# Patient Record
Sex: Female | Born: 1935 | Race: White | Hispanic: No | Smoking: Former smoker
Health system: Southern US, Community
[De-identification: ages and names within clinical notes are randomized; demographics above are authoritative.]

---

## 1999-04-15 ENCOUNTER — Inpatient Hospital Stay (HOSPITAL_COMMUNITY): Admission: EM | Admit: 1999-04-15 | Discharge: 1999-04-25 | Payer: Self-pay | Admitting: Emergency Medicine

## 1999-04-15 ENCOUNTER — Encounter: Payer: Self-pay | Admitting: Emergency Medicine

## 1999-04-17 ENCOUNTER — Encounter: Payer: Self-pay | Admitting: Cardiology

## 1999-04-18 ENCOUNTER — Encounter: Payer: Self-pay | Admitting: Neurology

## 1999-04-19 ENCOUNTER — Encounter: Payer: Self-pay | Admitting: Cardiology

## 1999-04-21 ENCOUNTER — Encounter: Payer: Self-pay | Admitting: Cardiology

## 1999-06-24 ENCOUNTER — Inpatient Hospital Stay (HOSPITAL_COMMUNITY): Admission: EM | Admit: 1999-06-24 | Discharge: 1999-07-04 | Payer: Self-pay | Admitting: Internal Medicine

## 1999-06-24 ENCOUNTER — Encounter: Payer: Self-pay | Admitting: Internal Medicine

## 1999-06-25 ENCOUNTER — Encounter (INDEPENDENT_AMBULATORY_CARE_PROVIDER_SITE_OTHER): Payer: Self-pay | Admitting: *Deleted

## 1999-06-26 ENCOUNTER — Encounter: Payer: Self-pay | Admitting: Thoracic Surgery (Cardiothoracic Vascular Surgery)

## 1999-06-27 ENCOUNTER — Encounter: Payer: Self-pay | Admitting: Thoracic Surgery (Cardiothoracic Vascular Surgery)

## 1999-06-28 ENCOUNTER — Encounter: Payer: Self-pay | Admitting: Thoracic Surgery (Cardiothoracic Vascular Surgery)

## 1999-06-29 ENCOUNTER — Encounter: Payer: Self-pay | Admitting: Thoracic Surgery (Cardiothoracic Vascular Surgery)

## 1999-09-02 ENCOUNTER — Encounter (HOSPITAL_COMMUNITY): Admission: RE | Admit: 1999-09-02 | Discharge: 1999-12-01 | Payer: Self-pay | Admitting: Cardiology

## 1999-11-11 ENCOUNTER — Encounter: Payer: Self-pay | Admitting: Cardiology

## 1999-11-11 ENCOUNTER — Ambulatory Visit (HOSPITAL_COMMUNITY): Admission: RE | Admit: 1999-11-11 | Discharge: 1999-11-11 | Payer: Self-pay | Admitting: Cardiology

## 1999-12-02 ENCOUNTER — Encounter (HOSPITAL_COMMUNITY): Admission: RE | Admit: 1999-12-02 | Discharge: 2000-03-01 | Payer: Self-pay | Admitting: Cardiology

## 2000-05-17 ENCOUNTER — Encounter (HOSPITAL_COMMUNITY): Admission: RE | Admit: 2000-05-17 | Discharge: 2000-08-15 | Payer: Self-pay | Admitting: Cardiology

## 2000-08-16 ENCOUNTER — Encounter (HOSPITAL_COMMUNITY): Admission: RE | Admit: 2000-08-16 | Discharge: 2000-11-14 | Payer: Self-pay | Admitting: Cardiology

## 2001-11-04 ENCOUNTER — Ambulatory Visit (HOSPITAL_COMMUNITY): Admission: RE | Admit: 2001-11-04 | Discharge: 2001-11-04 | Payer: Self-pay | Admitting: *Deleted

## 2004-10-16 ENCOUNTER — Encounter: Admission: RE | Admit: 2004-10-16 | Discharge: 2004-10-16 | Payer: Self-pay | Admitting: Internal Medicine

## 2004-10-29 ENCOUNTER — Encounter: Admission: RE | Admit: 2004-10-29 | Discharge: 2004-10-29 | Payer: Self-pay | Admitting: Internal Medicine

## 2004-11-18 ENCOUNTER — Inpatient Hospital Stay (HOSPITAL_COMMUNITY): Admission: EM | Admit: 2004-11-18 | Discharge: 2004-12-02 | Payer: Self-pay | Admitting: Emergency Medicine

## 2004-11-19 ENCOUNTER — Encounter (INDEPENDENT_AMBULATORY_CARE_PROVIDER_SITE_OTHER): Payer: Self-pay | Admitting: *Deleted

## 2005-03-09 ENCOUNTER — Encounter: Admission: RE | Admit: 2005-03-09 | Discharge: 2005-03-09 | Payer: Self-pay | Admitting: Internal Medicine

## 2005-04-01 ENCOUNTER — Encounter: Admission: RE | Admit: 2005-04-01 | Discharge: 2005-04-01 | Payer: Self-pay | Admitting: Geriatric Medicine

## 2006-05-25 ENCOUNTER — Encounter: Admission: RE | Admit: 2006-05-25 | Discharge: 2006-05-25 | Payer: Self-pay | Admitting: Geriatric Medicine

## 2007-05-22 ENCOUNTER — Inpatient Hospital Stay (HOSPITAL_COMMUNITY): Admission: EM | Admit: 2007-05-22 | Discharge: 2007-05-28 | Payer: Self-pay | Admitting: Emergency Medicine

## 2007-05-22 ENCOUNTER — Ambulatory Visit: Payer: Self-pay | Admitting: Internal Medicine

## 2007-05-23 ENCOUNTER — Ambulatory Visit: Payer: Self-pay | Admitting: Vascular Surgery

## 2007-05-23 ENCOUNTER — Encounter (INDEPENDENT_AMBULATORY_CARE_PROVIDER_SITE_OTHER): Payer: Self-pay | Admitting: Internal Medicine

## 2007-06-18 IMAGING — US US PELVIS COMPLETE MODIFY
1 series · 14 of 21 positions shown · non-contrast
Comparison: none

CLINICAL DATA: Follow up cyst noted in pelvis on MR.
 ULTRASOUND OF THE PELVIS:
 The MR of the lumbar spine of 10/16/04 describes a cystic structure in the left adnexa of 3.3 cm.  This patient could not tolerate transvaginal ultrasound and therefore, only transabdominal pelvic ultrasound was performed.  The uterus is normal in size for age measuring 6.0 cm sagittally with a depth of 3.0 cm and width of 2.6 cm.  Endometrium could not be visualized but did not appear to be abnormally thickened.  Right ovary was not visible, and no left ovarian tissue was seen.  Within the left adnexa, there is a cystic structure of 4.9 x 2.5 x 3.4 cm.  No internal septations or soft tissue is evident.  Follow up ultrasound may be helpful to assess stability over time.  No free fluid is seen.

[Series 1: unknown · 0.26mm/px · 14 of 21 slices shown]
[im 1/21]
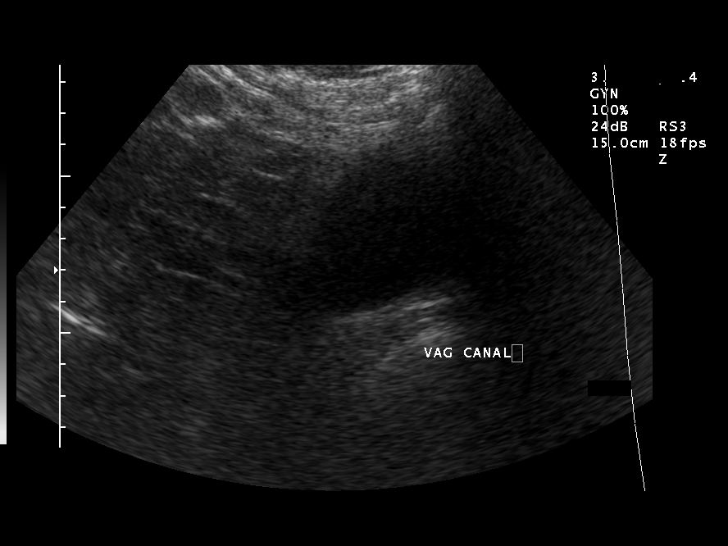
[im 3/21]
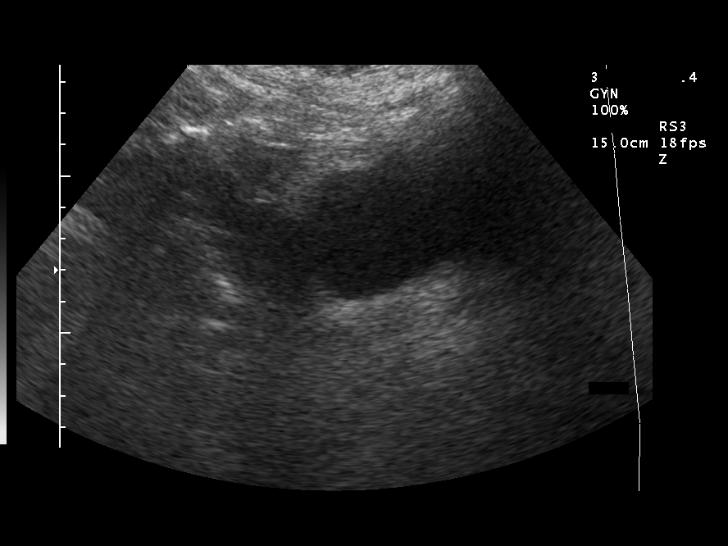
[im 4/21]
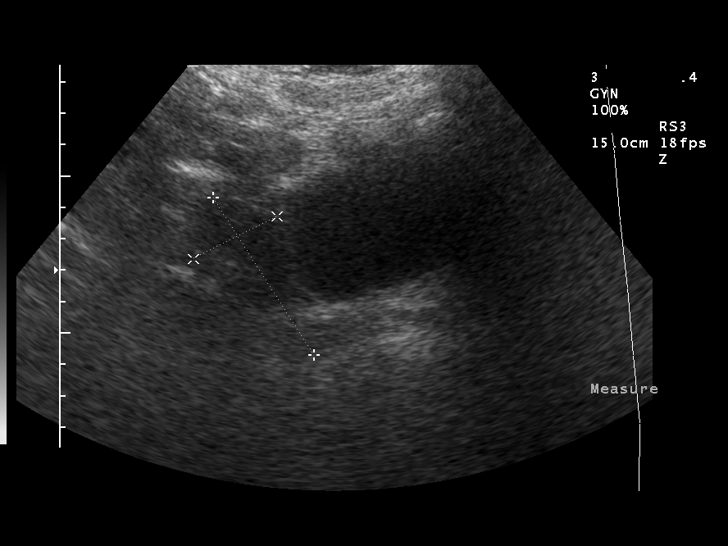
[im 6/21]
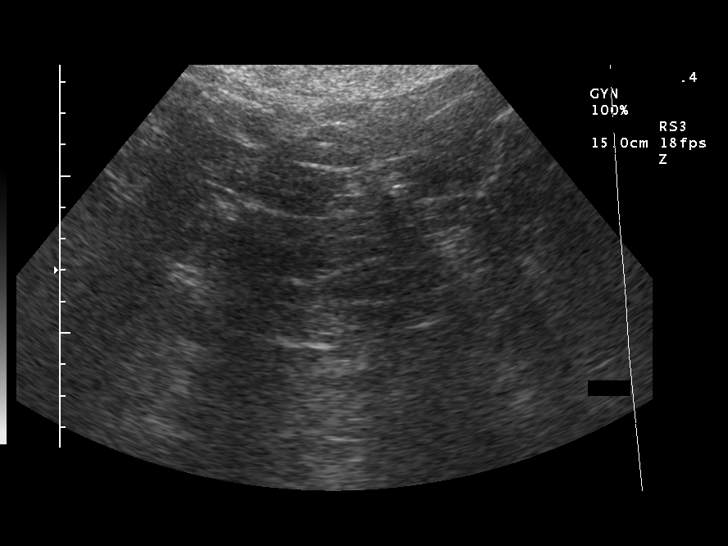
[im 7/21]
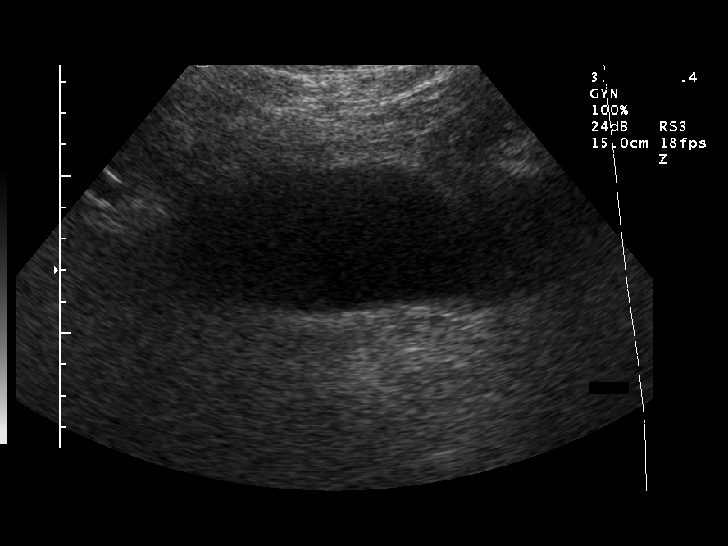
[im 9/21]
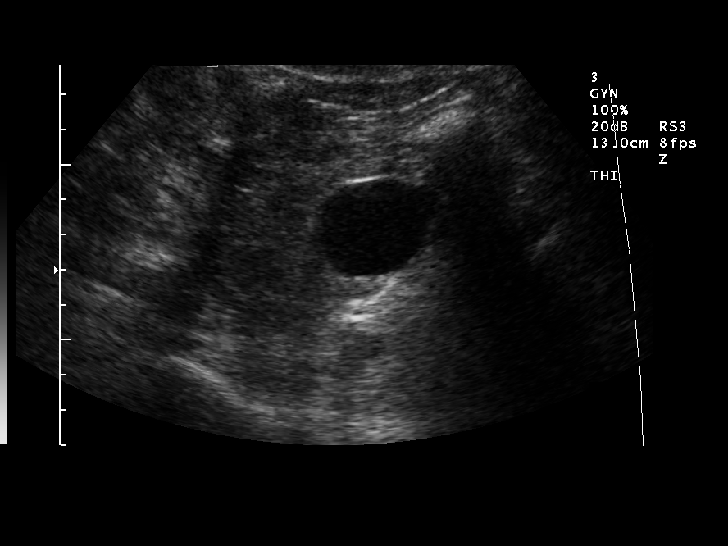
[im 10/21]
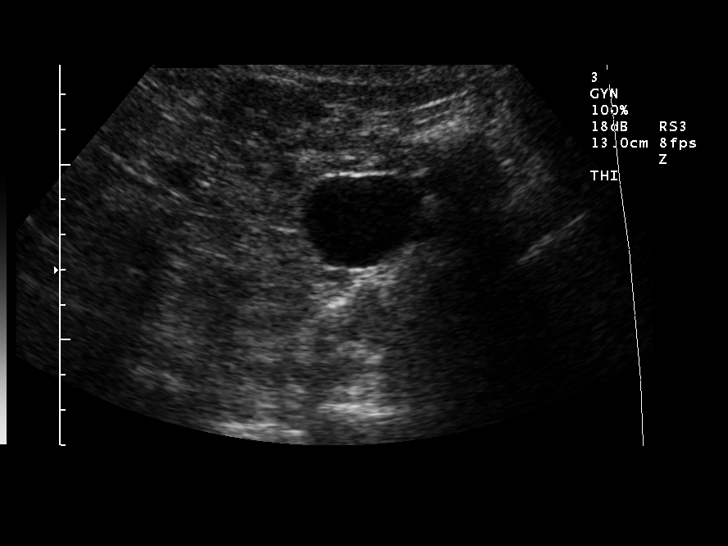
[im 12/21]
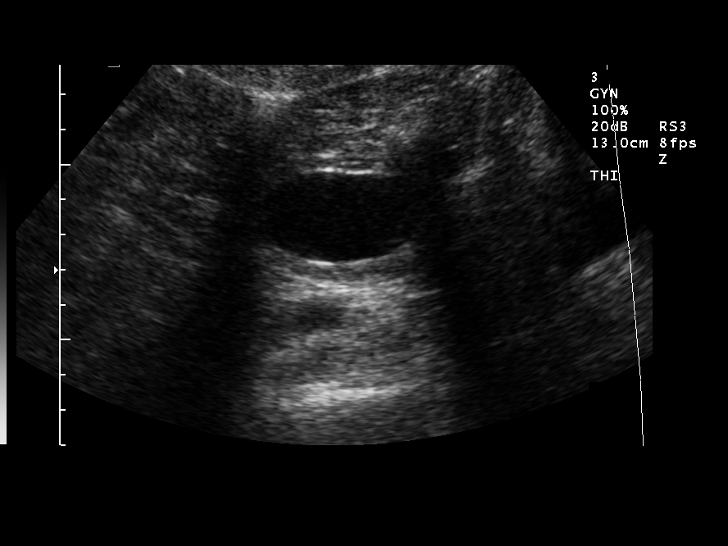
[im 13/21]
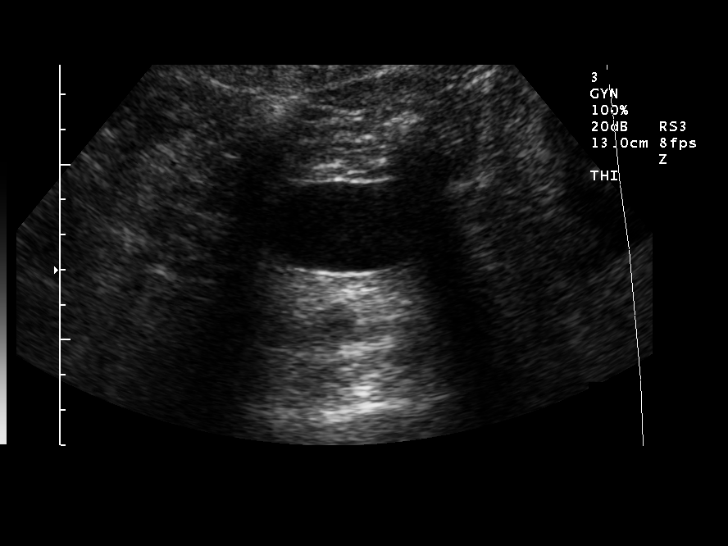
[im 15/21]
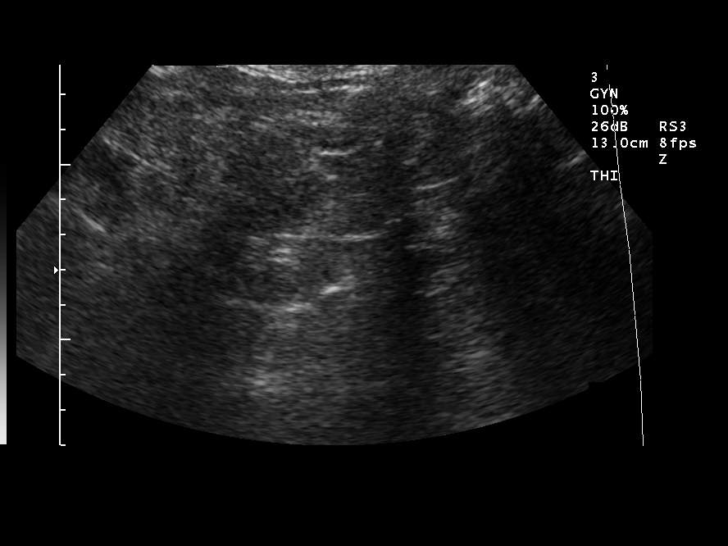
[im 16/21]
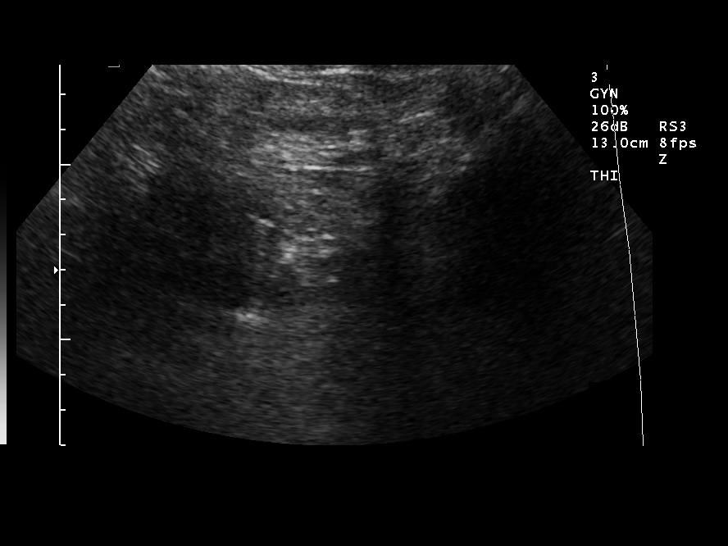
[im 18/21]
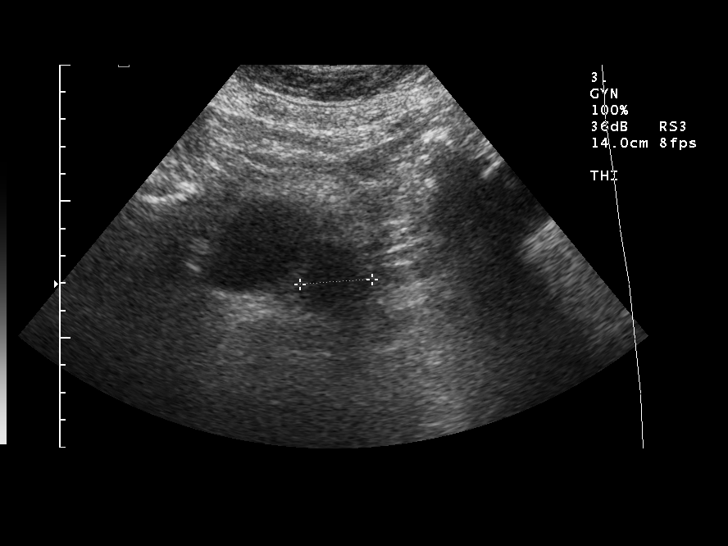
[im 19/21]
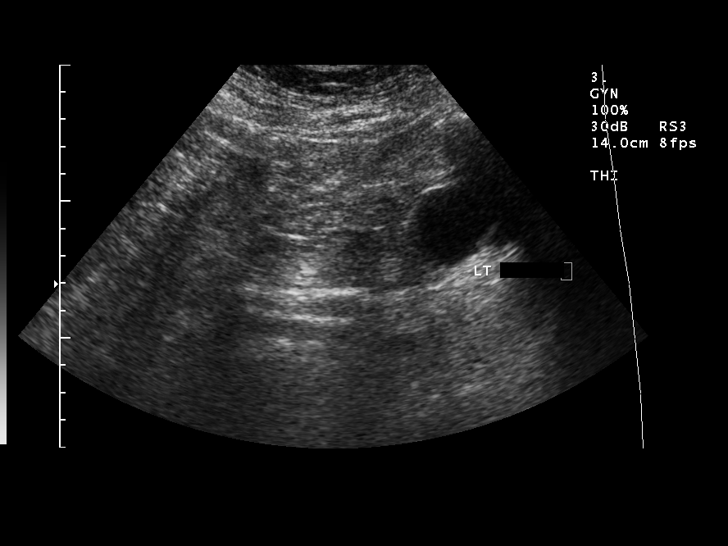
[im 21/21]
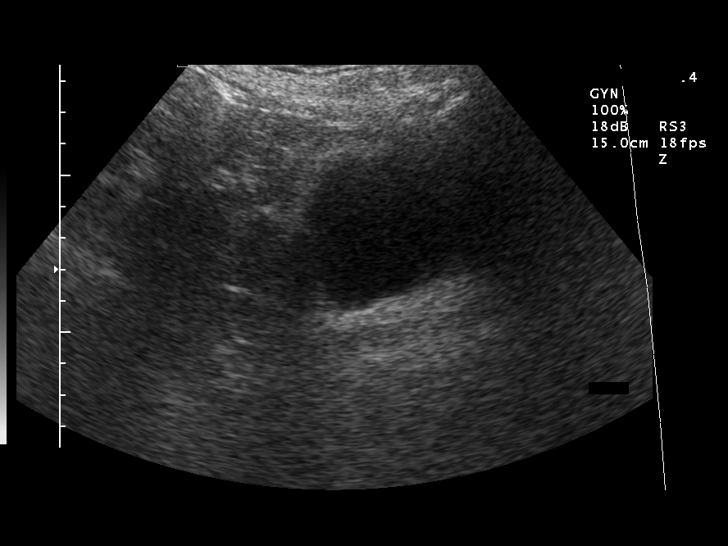

[14 of 21 positions shown; findings below may reference images not displayed]

IMPRESSION: Simple left adnexal cyst is noted of 4.9 x 2.5 x 3.4 cm.  No definite ovarian tissue is seen on either the left or the right.  Consider follow up ultrasound in four months.

## 2008-05-11 ENCOUNTER — Encounter: Admission: RE | Admit: 2008-05-11 | Discharge: 2008-05-11 | Payer: Self-pay | Admitting: Geriatric Medicine

## 2008-07-17 ENCOUNTER — Encounter: Admission: RE | Admit: 2008-07-17 | Discharge: 2008-07-17 | Payer: Self-pay | Admitting: Geriatric Medicine

## 2010-02-16 ENCOUNTER — Encounter: Payer: Self-pay | Admitting: Geriatric Medicine

## 2010-06-10 NOTE — Cardiovascular Report (Signed)
Virginia Herrera, AHMAD            ACCOUNT NO.:  [id number]   MEDICAL RECORD NO.:  [id number]          PATIENT TYPE:  INP   LOCATION:  3701                         FACILITY:  [facility]   PHYSICIAN:  Antionette Char, MD    DATE OF BIRTH:  Jun 13, 1911   DATE OF PROCEDURE:  05/24/2007  DATE OF DISCHARGE:                            CARDIAC CATHETERIZATION   PROCEDURES:  1. Left heart catheterization.  2. Coronary cineangiography.  3. Vein graft cines.  4. Left internal mammary arterial graft cines.  5. Left ventricular angiography.   INDICATION FOR PROCEDURES:  This 77 year old female was admitted with a  syncopal episode with collapse.  She has a history of coronary artery  disease with a prior syncopal episode requiring resuscitation in 2001.  This was followed by a cardiac cath and subsequent coronary artery  bypass graft surgery.  She now returns with a syncopal episode with  collapse and her cardiac enzymes were abnormal for myocardial ischemia.  She was scheduled for cardiac cath today to assess her coronary and  graft status.   PROCEDURE:  After signing an informed consent, the patient was  premedicated with 5 mg of Valium by mouth and brought to the cardiac  catheterization lab at [facility].  Her right groin was prepped  and draped in sterile fashion and anesthetized locally with 1%  lidocaine.  A 6-French introducer sheath was inserted percutaneously  into the right femoral artery.  6-French #4 Judkins coronary catheters  were used to make injections into the native coronary arteries.  The  right coronary catheter was used to make injections into the vein grafts  and also into the left internal mammary arterial graft to the LAD.  A 6-  French pigtail catheter was used to measure pressures in the left  ventricle and aorta and to make a midstream injection into the left  ventricle.  A pullback across the aortic valve was recorded.  The  patient tolerated the procedure  well and no complications were noted.  At the end of the procedure, the catheter and sheath removed from the  right femoral artery and hemostasis was easily obtained.  She was then  monitored in the holding area until transferred back to her telemetry  bed.  Medications given, none.   HEMODYNAMIC DATA:  There was no gradient across the aortic valve.   CINE FINDINGS:  Coronary cineangiography, left coronary artery, the  ostium and left main are severely diseased with total occlusion of the  left main in its proximal segment.  There is no antegrade flow into the  left coronary artery.  The LAD and circumflex filled by way of grafts.  Right coronary artery, the right coronary artery is severely diseased  throughout.  The ostium has a stenotic plaque and this plaque extends  into the middle segment.  There are irregularities throughout the  proximal and middle segment and also extending into the crux.  The  posterior descending is totally occluded at its origin.  The first large  anterolateral branch to the left ventricle has a critical stenosis in  its proximal segment of approximately  95%.  The distal portion of this  branch fills by way of vein graft.  The posterior descending also fills  by way of vein graft.  The vein grafts cines, right coronary artery vein  graft; this is a sequential vein graft to the distal right coronary  system with a side-to-side anastomosis within the posterior descending  and good flow seen into the posterior descending.  The posterior  descending has a plaque in its middle segment causing a 40-50%  narrowing.  The inside anastomosis in the posterolateral branch appears  normal and there is very good flow into the large posterolateral branch.  No further plaque is seen in the middle or distal segment.  Circumflex  vein graft, this vein graft also is normal in appearance with a distal  anastomosis into a large obtuse marginal branch.  There is very good  flow  into this obtuse marginal branch with retrograde flow showing a  severe stenosis in its proximal segment.  The circumflex and AV groove  is also visualized and appears to be fairly normal.  The lesion in the  proximal large obtuse marginal branch is approximately 90% stenotic, but  is essentially unapproachable for an intravascular intervention.  Left  internal mammary arterial graft to the LAD, the LIMA graft is normal in  appearance and has a normal anastomosis in the mid LAD.  The entire LAD  is visualized and there is severe disease proximally with several 90%  focal lesions in the proximal segment and a focal 90% stenosis in the  distal segment.  Left ventricular cineangiogram, the left ventricular  chamber size and contractility appear normal.  The overall left  ventricular contractility appears normal with an ejection fraction of  approximately 60%.  There is mild mitral insufficiency seen.  However,  this is difficult to determine because of the frequency of ectopic beats  when the pigtail catheter was within the left ventricular cavity.   FINAL DIAGNOSES:  1. Severe three-vessel coronary artery disease with totally occluded      left main coronary artery and diffusely diseased right coronary      artery with occluded right posterior descending artery.  2. Very good appearance of her grafts with a sequential vein graft to      her right posterior descending and right posterolateral branch to      the left ventricle, normal-appearing vein graft to the circumflex,      and normal-appearing LIMA graft to the LAD.  3. Good left ventricular function and ejection fraction of      approximately 60%.  4. Mild mitral insufficiency.   DISPOSITION:  After reviewing her cines with Dr. Clarene Duke, we found that  there are several areas of potential ischemia, but these areas are not  amenable to an intravascular intervention procedure.  Therefore, we  should continue to treat her medically.   With the 2 episodes of syncope  associated with collapse and myocardial ischemia, we should consider  ventricular tachycardia as the etiology of her syncope.  We will ask the  electrophysiology service to see her in consultation.      Antionette Char, MD  Electronically Signed     JRT/MEDQ  D:  05/24/2007  T:  05/25/2007  Job:  [id number]

## 2010-06-10 NOTE — Op Note (Signed)
NAME:  Virginia Herrera, Virginia Herrera            ACCOUNT NO.:  [id number]   MEDICAL RECORD NO.:  [id number]          PATIENT TYPE:  INP   LOCATION:  3701                         FACILITY:  [facility]   PHYSICIAN:  Rollene Rotunda, MD, FACCDATE OF BIRTH:  [birth year]   DATE OF PROCEDURE:  05/27/2007  DATE OF DISCHARGE:                               OPERATIVE REPORT   PREOPERATIVE DIAGNOSIS:  Syncope.   POSTOPERATIVE DIAGNOSIS:  Syncope.   PROCEDURE:  Implantable loop recorder.   PROCEDURE NOTE:  The patient signed informed consent.  This procedure  was suggested after EP consult with Dr. Lewayne Bunting.  The patient was  brought to the catheterization lab.  [employer] mapped an area for optimal  waveform capture over her left chest.  The patient's left chest was then  prepped and draped in a sterile fashion.  Her subcutaneous tissue was  anesthetized with lidocaine infusion.  A 3-cm incision was carried down  through the subcutaneous tissue.  Then, this was carried down further to  the prepectoral fascia with blunt dissection.  A pocket for the device  was formed, primarily with blunt dissection.  The pocket was copiously  irrigated with kanamycin solution.  The device which was a Transport planner, model number [id number], serial number [id number] was then implanted  and sutured to the prepectoral fascia using 2 silk pop-off sutures.  The  device was placed in the pocket and copiously irrigated again.  The  wound was then closed with 2-0 Vicryl in running vertical mattress  followed by the same 2-0 Vicryl in a running horizontal mattress.  The  subcutaneous layer was 4-0 Vicryl in a running horizontal mattress.  The  wound was prepped with benzoin and closed with Steri-Strips.  The  patient left the lab in stable condition.  There were no complications.      Rollene Rotunda, MD, [facility]  Electronically Signed     JH/MEDQ  D:  05/27/2007  T:  05/28/2007  Job:  [id number]   cc:   Antionette Char, MD

## 2010-06-10 NOTE — H&P (Signed)
NAME:  Virginia Herrera, ROGEL            ACCOUNT NO.:  [id number]   MEDICAL RECORD NO.:  [id number]          PATIENT TYPE:  INP   LOCATION:  3701                         FACILITY:  [facility]   PHYSICIAN:  Kela Millin, M.D.DATE OF BIRTH:  1911-06-16   DATE OF ADMISSION:  05/22/2007  DATE OF DISCHARGE:                              HISTORY & PHYSICAL   PRIMARY CARE PHYSICIAN:  Hal T. Stoneking, M.D.   CHIEF COMPLAINT:  Status post fall and unable to get up for 1-2 days.   HISTORY OF PRESENT ILLNESS:  The patient is a 77 year old white female  resident of [facility] with past medical  history significant for MI/coronary artery disease, congestive heart  failure with last ejection fraction 30-40% in 2006, hypertension,  history of pneumonia with respiratory failure, anxiety, and anemia, who  presents with above complaints.  She states that she does not remember  how she even got to the floor because she was not aware that she had  fallen and was on the floor until EMS came into her apartment and found  her there.  It is noted in the ER records that the patient initially  reported that she last remembered getting up to go to the bathroom, but  at the time of my interview, she did not even know that she was on the  floor.  All she was aware of is that she was in bed and trying to get  someone to help her because she was unable to get up.  She states that  she called out for some time, but nobody ever came to her aid.  She  remembers that earlier in the week, she had gone out and visited some  family, and they brought her back to her apartment.  She thinks she had  been lying on the floor for at least one whole day and one whole  tonight, until today, Sunday, when her friends came by to pick her up to  go to church, and when they were unable to get into the apartment, they  called someone with [facility], who was able to come and open the doors,  then they found the  patient on the floor, and EMS was called.  She  remembers that because she was unable to get up, she urinated on herself  and was lying on her left side all the while and developed blisters on  her left arm and shoulder area.  This had burst open at the time she was  seen in the ER.  It is unclear whether the patient had a syncopal  episode, as she does not recall the events that led up to her being on  the floor.  She denies chest pain, cough, fevers, shortness of breath,  diarrhea, melena, hematochezia, dysuria, and no nausea or vomiting.   The patient was seen in the ER, and a CT scan of her brain showed no  evidence for acute hemorrhage.  Cerebral atrophy and evidence for  chronic small vessel ischemic changes noted.   An x-ray of her left shoulder revealed diffuse osteopenia with no  visible fracture or dislocation.  Also, a left humerus x-ray showed no  fracture or dislocation.   X-rays of her left forearm and left elbow were negative for fractures as  well.   Other lab work included a urinalysis, which showed a large hemoglobin  but only with 7-10 WBCs and otherwise negative for infection.  Her white  cell count was 15.4 with a hemoglobin of 15, hematocrit 44.7, and her  point-of-care markers were negative.  Her BUN 37 with a creatinine of  1.6.  It is noted that her last creatinine in 2006 per E-chart was 1.3.  She is admitted for further evaluation and management.   PAST MEDICAL HISTORY:  1. As above.  2. History of abnormal thyroid function tests with question of      hypothyroidism but on no medications.  3. Question history of diabetes.  On no medications.   MEDICATIONS:  1. Aspirin 81 mg daily.  2. Colace.  3. Cozaar.  4. Lasix 160 mg daily.  5. Isosorbide mononitrate 30 mg daily.  6. KCL 20 mEq 2 tablets daily.  7. Metoprolol 25 mg b.i.d.  8. Zocor 20 mg nightly.   ALLERGIES/INTOLERANCES:  She is intolerant of MORPHINE and VIOXX per old  records.   SOCIAL  HISTORY:  Remote history of tobacco abuse.  She denies alcohol.   FAMILY HISTORY:  Her mother had a stroke, and one of her siblings had an  MI in his 35s.   REVIEW OF SYSTEMS:  As per HPI.  Other review of systems negative.   PHYSICAL EXAMINATION:  GENERAL:  The patient is a pleasant elderly white  female.  She is alert, oriented, and appropriate in no respiratory  distress.  VITAL SIGNS:  Her temperature is 96.9.  Blood pressure is 146/58,  initially 189/85.  Pulse 95.  Respiratory rate 18.  O2 sat was 96%.  HEENT:  PERRL.  EOMI.  Sclerae are anicteric.  Moist mucous membranes.  Normocephalic and atraumatic.  NECK:  Supple.  No adenopathy.  No JVD.  No thyromegaly.  LUNGS:  Decreased breath sounds at the bases.  No crackles.  No wheezes.  CARDIOVASCULAR:  Regular rate and rhythm.  Normal S1 and S2.  No S3  appreciated.  ABDOMEN:  Soft, obese.  Bowel sounds present.  Nontender, nondistended.  No organomegaly.  No masses palpable.  EXTREMITIES:  On the flexor surface of her left upper extremity, she has  mild erythema and moderate-sized areas of skin that is de-  epithelialized, desquamated.  Also a similar area on her left knee area.  No edema.  No cyanosis.  NEURO:  Alert and oriented x3.  Cranial nerves II-XII are grossly  intact.  No facial asymmetry.  Her strength is about 4/5 and symmetric.  Nonfocal exam.   LABORATORY DATA:  As per HPI.  Also, her platelet count is 166.  Her  sodium is 143 with a potassium of 3.7, chloride 107, BUN 37, creatinine  1.6, glucose 110.  Her total CK is 5175.  MB of 35.3.  Relative index  within normal limits at 0.7.   ASSESSMENT/PLAN:  1. Fall/?syncope:  CT scan of the head is negative.  Will monitor on      telemetry.  Obtain cardiac enzymes.  A corrected Doppler ultrasound      as well as a 2D echo.  2. Rhabdomyolysis:  Cautiously hydrate with normal saline for saline      diuresis.  Monitoring fluid status, given history  of congestive       heart failure, follow and recheck.  3. Acute-on-chronic renal insufficiency:  As above.  Hydrate.      Recheck.  Hold ARB for now and follow.  4. Leukocytosis:  Urinalysis negative for infection.  Will obtain      chest x-ray.  Follow and treat accordingly.  5. Question of hypothyroidism:  Will check a TSH level.  6. Question history of diabetes:  On no medications.  Monitor Accu-      Cheks, sliding scale, and obtain a hemoglobin A1C level.  7. Hypertension:  Continue beta blockers.  8. History of congestive heart failure:  Compensated.  Continue beta      blockers and monitor fluid status, as      discussed above.  9. History of anemia:  H&H stable.  Follow.  10.Left upper extremity wounds:  Local wound care/wound care consult.      Follow.      Kela Millin, M.D.  Electronically Signed     ACV/MEDQ  D:  05/23/2007  T:  05/23/2007  Job:  [id number]   cc:   Hal T. Stoneking, M.D.

## 2010-06-10 NOTE — Consult Note (Signed)
NAME:  Virginia Herrera, Virginia Herrera            ACCOUNT NO.:  [id number]   MEDICAL RECORD NO.:  [id number]          PATIENT TYPE:  INP   LOCATION:  3701                         FACILITY:  [facility]   PHYSICIAN:  Doylene Canning. Ladona Ridgel, MD    DATE OF BIRTH:  [birth year]   DATE OF CONSULTATION:  05/26/2007  DATE OF DISCHARGE:                                 CONSULTATION   REQUESTING PHYSICIAN:  Dr. Charolette Child.   INDICATION FOR CONSULTATION:  Evaluation of the patient with syncope.   HISTORY OF PRESENT ILLNESS:  The patient is 77 years old.  She has done  amazingly well through the years, though she does have known coronary  disease and is status post bypass surgery in the past.  This is in 2001  at age 12.  Despite this, she has been quite active until she presented  to the hospital.  The patient states that she is not quite sure what  happened to her, she was sleeping and found herself on the floor and  apparently had been down for at least 12 hours if not 24 hours.  She had  mild-to-moderate rhabdomyolysis.  She has recovered from this.  Her  cardiac enzymes however despite the elevation of the MB and the troponin  and for this she underwent catheterization by Dr. Aleen Campi.  This  demonstrated severe three-vessel coronary disease, but there was no  stenoses of her grafts.  She was noted to have preserved LV function by  LV gram.  Review of her cath films however did demonstrate several  potential areas of ischemia, which were not thought to be minimal to  intravascular intervention.  The patient is now referred for additional  evaluation.  She denies chest pain.  She denies nausea and vomiting.   PAST MEDICAL HISTORY:  Additional past medical history is notable for  chronic renal insufficiency.  There is a questionable history of  borderline diabetes.  She has hypertension.   FAMILY HISTORY:  Noncontributory at her advanced age.   SOCIAL HISTORY:  The patient lives independently.  She has a remote  history of tobacco use, but none now.  She denies alcohol use.   REVIEW OF SYSTEMS:  Otherwise, her review of systems is negative except  as noted in the HPI.   PHYSICAL EXAMINATION:  GENERAL:  She is a pleasant elderly-appearing  woman who looks younger than her stated age.  VITAL SIGNS:  Blood pressure was 150/75, pulse was 60 and regular,  respirations were 18, and temperature was 98.  HEENT:  Normocephalic and atraumatic.  Pupils equal and round.  Oropharynx moist.  Sclerae anicteric.  NECK:  Revealed no jugular distention.  No thyromegaly.  Trachea is  midline.  Carotid are 2+ and symmetric.  LUNGS:  Clear bilaterally to auscultation.  No wheezes, rales, or  rhonchi are present.  No increased work of breathing.  CARDIAC:  Regular rate and rhythm.  Normal S1 and S2.  There is a soft  systolic murmur at left lower sternal border.  The PMI was not enlarged  nor was it laterally displaced.  ABDOMEN:  Obese, nontender,  and nondistended.  There is no organomegaly.  Bowel sounds are present.  EXTREMITIES:  Demonstrated no cyanosis, clubbing or edema.  The pulse  was 2+ and symmetric.  NEUROLOGIC:  Alert and oriented x3.  Cranial nerves intact.  Strength is  5/5 and symmetric.   IMPRESSION:  1. Unexplained syncope.  2. Severe coronary disease with patent grafts and preserved LV      function.  3. Rhabdomyolysis secondary to her syncopal episode and prolonged down      time.  4. Diastolic heart failure.   DISCUSSION:  The situation is fairly difficult in terms of knowing what  might have been the etiology of her syncope and what the best most  appropriate workup is with her advanced age and multiple comorbidities.  At this time, I have recommended that we proceed with an implantable  loop recorder insertion.  I have discussed with Dr. Aleen Campi, who is her  primary cardiologist, and we both agree that this seems like her most  reasonable plan.  With her preserved LV function, her  risk of sudden  death is quite low.  I would attempt to schedule this as soon as  possible convenient time.      Doylene Canning. Ladona Ridgel, MD  Electronically Signed     GWT/MEDQ  D:  05/27/2007  T:  05/27/2007  Job:  [id number]   cc:   Hal T. Stoneking, M.D.

## 2010-06-10 NOTE — Discharge Summary (Signed)
NAME:  Virginia Herrera, Virginia Herrera            ACCOUNT NO.:  [id number]   MEDICAL RECORD NO.:  [id number]          PATIENT TYPE:  INP   LOCATION:  3701                         FACILITY:  [facility]   PHYSICIAN:  Corinna L. Lendell Caprice, MDDATE OF BIRTH:  1911-02-06   DATE OF ADMISSION:  05/22/2007  DATE OF DISCHARGE:  05/28/2007                               DISCHARGE SUMMARY   DISCHARGE DIAGNOSES:  1. Status post fall with prolonged inability to get up.  2. Rhabdomyolysis most likely secondary to above.  The patient was      also on a statin as an outpatient.  3. Acute renal insufficiency.  4. Dehydration.  5. Leukocytosis, resolved.  6. History of congestive heart failure secondary to systolic      dysfunction, compensated.  7. History of anemia.  8. Multiple skin tears/abrasions.  9. Mild cellulitis of the left upper extremity.  10.Severe three vessel coronary artery disease with catheterization      showing good left ventricular function of 60%.  11.Status post loop recorder placement.  12.Resolved hypokalemia.  13.Deconditioning.  14.History of diabetes.  15.History of anxiety.  16.Previous history of hypothyroidism with currently normal TSH.  17.History of coronary artery bypass graft.  18.Hyperlipidemia.   DISCHARGE MEDICATIONS:  1. She is to stop Zocor.  2. Continue aspirin 81 mg a day.  3. Colace 100 mg p.o. b.i.d.  4. Continue Imdur 30 mg a day.  5. She has been on 40 mg of Lasix while here but can resume Lasix 160      mg a day as previous.  6. Continue KCl 20 mEq p.o. b.i.d.  7. Continue metoprolol 25 mg p.o. b.i.d.  8. Continue Cozaar 50 mg p.o. daily.  9. Os-Cal 500 mg one p.o. b.i.d.  10.Fish oil 1000 mg q.i.d.  11.Glucosamine and chondroitin b.i.d.  12.Tylenol 650 mg p.o. q.4 h p.r.n. pain.  13.Doxycycline 100 mg p.o. b.i.d. for 3 more days.   DIET:  Low salt.   ACTIVITY:  She should be on fall precautions.  She is being transferred  to skilled nursing facility for  physical therapy, occupational therapy,  short-term with a goal to return back to independent living.   CONDITION:  Stable.   CONSULTATIONS:  Antionette Char, MD, Doylene Canning. Ladona Ridgel, MD.   PROCEDURES:  Cardiac catheterization showing severe three vessel  coronary disease with totally occluded left main coronary and diffusely  diseased right coronary with occluded right posterior descending artery,  good appearance of her grafts, good left ventricular function, ejection  fraction 60%, mild mitral insufficiency and also a loop recorder was  placed on May 1.   PERTINENT LABORATORY:  Initial CBC was significant for a white blood  cell count of 15,000 with 86% neutrophils, 8% lymphocytes, hemoglobin of  16, hematocrit of 47, normal platelet count.  At discharge her white  blood cell count is 7.3, hemoglobin 12.6, platelet count 134.  PT/PTT  normal.  Basic metabolic panel on admission significant for a BUN of 37  and creatinine of 1.6.  Her potassium dropped to a low of 3.4 and was  repleted.  At  discharge her BUN is 15, creatinine 1.03 and potassium  3.7.  Initial total CPK was 5175, on April 29 it had decreased to 1000.  Her CPK-MB was 35, initial troponin normal and it peaked at 0.15.  Myoglobin greater than 500.  TSH 0.322, total T3 is 78, free T4 1.02.  Urinalysis showed large blood, 30 ketone, negative nitrite, negative  leukocyte esterase, 7-10 red cells, few bacteria.  Urine culture  negative.   SPECIAL STUDIES-RADIOLOGY:  EKG showed normal sinus rhythm with left  axis deviation, nonspecific intraventricular conduction block, cannot  rule out age indeterminate anterior infarct.  CT of the brain showed  nothing acute, cerebral atrophy and chronic small vessel changes.  Left  hip x-ray showed diffuse osteopenia without fracture.  Left shoulder x-  ray showed diffuse osteopenia without fracture.  Left humerus x-ray  showed no fracture.  Left forearm x-ray showed no fracture.  Left  elbow  x-ray showed no fracture.  Chest x-ray on admission showed bibasilar  opacities similar to previous.   HISTORY AND HOSPITAL COURSE:  Ms. Andal is a 77 year old white female  patient of Dr. Pete Glatter from independent living at [facility] who  presented several days after having fallen.  Apparently she fell out of  bed and insists that she did not lose consciousness but she does not  really recall the particulars of the fall.  She was unable to get out  for a day or two.  Finally she was found and sent to the emergency room  where she was found to be in rhabdomyolysis, also was dehydrated and had  acute renal insufficiency.  She is on Zocor but I suspect this was from  her fall and prolonged period on the floor.  She has multiple medical  problems.  Please see above and H and P.  The elevated troponins were  felt to be due to the rhabdomyolysis but she does have a history of  heart disease and is a somewhat difficult historian.  Therefore  cardiology was consulted.  Dr. Aleen Campi did the cardiac catheterization  with results as above.  He recommended medical management and was  concerned about possibly an arrhythmia causing the fall and inability to  get up.  Therefore Dr. Lewayne Bunting was consulted and he recommended a  loop recorder which was placed.  The patient's rhabdomyolysis has  improved with IV fluids as has her renal insufficiency.  She has had no  chest pain since being here nor any significant ectopy.  She has a  history of congestive heart failure secondary to systolic dysfunction  but her ejection fraction on this most recent catheterization has  improved.  Apparently her ejection fraction previously was 30-40%.   She had multiple bruises, abrasions, skin tears and some blistering  particularly of the left forearm from her fall.  Initially we just  placed Neosporin to these areas and the wound care specialist  recommended Mepilex Lite to the area on the left arm.   This did develop  some surrounding erythema which I feel is very mild cellulitis and she  was started on doxycycline with good results.  The Mepilex Lite can be  continued and changed every 5 days or as needed.  The patient has been  noted by me to be pulling the dressings off repeatedly, however.  The  patient also had carotid Dopplers which showed right-sided carotid  stenosis, moderate, 40-60%.  The patient received physical therapy,  occupational therapy and is felt to  need skilled nursing facility at  least short-term.  She remains deconditioned and begrudgingly agrees to  going to the skilled side of [facility].   She also had an echocardiogram which I failed to mention above that  showed ejection fraction of 50% or greater, but the study was  technically difficult.  She also had a left atrium that was mildly  dilated.  Her thyroid function tests were repeated and are consistent  with euthyroid sick so can be repeated in the future if needed but  apparently her TSH as an outpatient has been fine.   Her other multiple medical problems remained stable during her  hospitalization and I have kept her nephew, Dr. Henreitta Leber, whose  phone number is [phone] up to date.  Total time on the day of  discharge is 45 minutes.      Corinna L. Lendell Caprice, MD  Electronically Signed     CLS/MEDQ  D:  05/28/2007  T:  05/28/2007  Job:  [id number]   cc:   Hal T. Stoneking, M.D.  Antionette Char, MD  Doylene Canning. Ladona Ridgel, MD

## 2010-06-13 NOTE — Cardiovascular Report (Signed)
[place]  Patient:    Virginia Herrera, Virginia Herrera                   MRN: [id number] Proc. Date: 06/25/99 Adm. Date:  [id number] Attending:  Charlett Lango CC:         Soyla Murphy. [name], M.D.             [facility]                        Cardiac Catheterization  REFERRING PHYSICIAN:  Soyla Murphy. [name], M.D.  PROCEDURES: 1. Left heart catheterization. 2. Coronary cineangiography. 3. Left internal mammary artery cineangiography. 4. Left ventricular cineangiography. 5. Abdominal aortogram. 6. Perclose of the right femoral artery.  INDICATIONS FOR PROCEDURE:  This is an 77 year old female who was admitted to [facility] on Jun 24, 1999, with the recent onset of congestive heart failure.  She was noted to have marked changes on her electrocardiogram and positive enzymes consistent with myocardial ischemia.  She was then scheduled for cardiac cath.  She also has a history of hypertension.  DESCRIPTION OF PROCEDURE:  After signing an informed consent, the patient was premedicated with 50 mg of Benadryl intravenously and transported from her room at [facility] to the [facility] Cardiac Catheterization Lab. The right groin was prepped and draped in a sterile fashion and anesthetized locally with 1% lidocaine.  A 6-French introducer sheath was inserted percutaneously into the right femoral artery.  A 6-French #4 Judkins coronary catheters were used to make injections into the coronary arteries.  The right coronary catheter was used to make a midstream injection into the left subclavian artery visualizing the left internal mammary artery.  A 6-French pigtail catheter was used to measure pressures in the left ventricle and aorta, and to make a midstream injection into the left ventricle and abdominal aorta.  The patient tolerated the procedure well and no complications were noted at the end of the procedure.  The catheter and sheath  were removed from the right femoral artery and hemostasis was easily obtained with a Perclose closure system.  During injection into the right femoral artery, we noticed a raised plaque in the right femoral artery with decreased antegrade flow. There was marked improvement in the pulse after removal of the sheath and after Perclose.  The patient was admitted to telemetry here at [facility] because of the presence of critical left main coronary artery disease and need for coronary artery bypass graft surgery.  MEDICATIONS GIVEN:  Versed 1 mg IV.  HEMODYNAMIC DATA:  Left ventricular pressure 134/20-31, aortic pressure 133/55 with a mean of 81.  Left ventricular ejection fraction was estimated at approximately 40%.  CINE FINDINGS:  Coronary cineangiography of left coronary artery.  The ostium has a minor plaque.  There is a critical 95-99% focal eccentric lesion at the termination of the left main coronary artery just prior to the bifurcation.  Left anterior descending - the LAD has diffuse plaque in the middle segment with a segmental 30-40% stenosis.  Circumflex coronary artery - the circumflex has a focal 70% stenosis in the large first obtuse marginal branch.  Right coronary artery - the right coronary artery is a large dominant vessel supplying the posterior descending and posterolateral circulation of the left ventricle.  The proximal segment including the ostium has a segmental plaque causing a 30-40% segmental stenosis.  There is intermittent plaque throughout the middle  segment and a segmental 30% plaque in the distal segment before the posterior descending.  The posterior descending has a 70% stenosis in its proximal segment.  LEFT VENTRICULAR CINEANGIOGRAM:  The left ventricular chamber size is mild to moderately enlarged.  The left ventricular wall thickness appears normal.  The overall left ventricular contractility is decreased with severe  anteroapical hypokinesia.  The ejection fraction was estimated at approximately 40%.  ABDOMINAL AORTOGRAM:  The abdominal aorta is diffusely atherosclerotic and mildly tortuous, but no area of aneurysm of stenosis.  The renal arteries were essentially normal with very minor plaque.  FINAL DIAGNOSES: 1. Critical three vessel coronary artery disease with 95-99% left main    coronary artery stenosis and diffuse right coronary artery disease. 2. Fair left ventricular function with ejection fraction of approximately 40%    and anteroapical hypokinesia. 3. Normal left internal mammary artery. 4. Diffuse atherosclerotic abdominal aorta with mild tortuosity and    essentially normal renal arteries. 5. Successful Perclose of the right femoral artery.  DISPOSITION:  Will admit to [facility] and ask CVTS to see for an emergent coronary artery bypass graft surgery.  Of note is that the raised plaque in the right femoral artery causing decreased antegrade flow was much improved after removal of the sheath and after Perclose closure. DD:  06/25/99 TD:  06/30/99 Job: [id number]

## 2010-06-13 NOTE — Op Note (Signed)
   NAME:  Virginia Herrera, Virginia Herrera                      ACCOUNT NO.:  [id number]   MEDICAL RECORD NO.:  [id number]                   PATIENT TYPE:  AMB   LOCATION:  ENDO                                 FACILITY:  [facility]   PHYSICIAN:  Georgiana Spinner, M.D.                 DATE OF BIRTH:  07/23/1911   DATE OF PROCEDURE:  11/04/2001  DATE OF DISCHARGE:                                 OPERATIVE REPORT   PROCEDURE:  Colonoscopy.   INDICATIONS:  Hemoccult positivity.   ANESTHESIA:  Demerol 20, Versed 2 mg.   DESCRIPTION OF PROCEDURE:  With patient mildly sedated in the left lateral  decubitus position, the Olympus videoscopic colonoscope was inserted in the  rectum and passed under direct vision with pressure applied to the abdomen  and rotating the patient to the recumbent and subsequently the right lateral  decubitus position, we reached the cecum, identified by the ileocecal valve  and appendiceal orifice, both of which were photographed.  From this point,  the colonoscope was slowly withdrawn, taking circumferential views of the  entire colonic mucosa, stopping then only in the rectum which appeared  normal on direct and showed hemorrhoids on retroflexed view.  The endoscope  was straightened and withdrawn.  The patient's vital signs and pulse  oximeter remained stable.  The patient tolerated the procedure well without  apparent complications.   FINDINGS:  Essentially negative examination other than hemorrhoids.   PLAN:  Have the patient follow up with me as an outpatient.                                               Georgiana Spinner, M.D.    GMO/MEDQ  D:  11/04/2001  T:  11/04/2001  Job:  [id number]

## 2010-06-13 NOTE — H&P (Signed)
NAME:  Virginia Herrera, Virginia Herrera NO.:  [id number]   MEDICAL RECORD NO.:  [id number]          PATIENT TYPE:  INP   LOCATION:  0102                         FACILITY:  [facility]   PHYSICIAN:  Hettie Holstein, D.O.    DATE OF BIRTH:  10/01/1911   DATE OF ADMISSION:  11/18/2004  DATE OF DISCHARGE:                                HISTORY & PHYSICAL   PRIMARY CARE PHYSICIAN:  Dr. Merri Brunette.   CARDIOLOGIST:  Dr. Charolette Child.   HISTORY OF PRESENTING ILLNESS:  Please note that mos history is obtained  from discussion with the patient's brother, as the patient is unable to  provide a full and detailed history; however, briefly, Virginia Herrera is a  pleasant 77 year old independently living Caucasian female with a known  history of coronary artery disease, status post coronary artery bypass in  July 2001, who had been in her usual state of health up until about a week  ago, at which time the brother stated that she had developed a cough, and  this had progressed.  Over the last couple of days, she had had some  episodes of confusion and was found today on the floor at home after several  attempts to call her were made, and there was no one to answer the phone.  The brother was unable to provide a time frame, but he felt that she must  have been on the floor for several hours.  She had been found with urine on  her clothes.  She was found to be initially hypoxic.  She was alert and  oriented.  She was transported to [facility] Emergency Department where she  was found to be hypoxic with O2 saturations of 64% on room air and  subsequently improved to over 90% on nonrebreather.  Hemodynamically, she  was stable initially.  She had a chest x-ray that did reveal a left lower  lobe large infiltrate and was found to have left bundle-branch block on her  EKG.  In comparison of previous EKGs, this was similar.  Her initial point  of care markers were positive, and consultation was sought with Dr.  Aleen Campi  of [facility] for potentially an acute MI.   Discussed with the patient's brother, Carole Binning, reachable at [phone]  regarding these events in addition to her end of life issues and code status  and it was discussed as well as rediscussed with the brother by Dr. Jacinto Halim,  and it was concluded as he was her power of attorney and assisted in making  her health care decisions, that she was No Code Blue status.   PAST MEDICAL HISTORY:  1.  Virginia Herrera has a known history of coronary artery disease followed by      Dr. Charolette Child in the outpatient setting, status post coronary artery      bypass in July 2001.  2.  History of hypertension and hyperlipidemia.  3.  Fibromyalgia, followed by Dr. Turner Daniels.  4.  Osteoarthritis.  5.  Status post tonsillectomy, D&C, and bypass as described above.   MEDICATIONS AS FOLLOWS:  As  obtained from medication list sent from primary  care physician's office:  1.  Metoprolol 100 mg daily.  2.  Lasix 20 mg daily.  3.  Potassium chloride 20 mEq daily.  4.  Glucosamine daily.  5.  Hyzaar 125, 1 tab daily.  6.  Norvasc 5 mg daily.  7.  Aspirin 81 mg daily.  8.  Advair 500/20 mg daily.   ALLERGIES:  There is a mention of MORPHINE causing nausea and VIOXX,  nonspecific complaint.   SOCIAL HISTORY:  The patient is a widow.  She has no living children.  Her  power of attorney is her brother, Carole Binning, [phone].  She has no alcohol  or tobacco history.  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  According to her brother, her home situation is  not optimal, and he has been encouraging her to transfer to a skilled  nursing facility and feels this is where she should appropriately locate  following this hospital course.   FAMILY HISTORY:  Father died with pneumonia.  Mother died with a stroke.  She has no children.  She did have 1 sibling who died of MI in his 44s.   REVIEW OF SYSTEMS:  Not  obtainable from the patient at this time, though she  reports she did have some cough.  No complaints of chest pain and unable to  provide whether she has had fevers.   PHYSICAL EXAMINATION IN THE EMERGENCY DEPARTMENT:  VITAL SIGNS:  The  patient's blood pressure was stable, and she was not hypotensive.  Her heart  rate was in the 90s, respirations 18-20, and she was afebrile.  O2  saturations on 100% nonrebreather were 91-92%.  GENERAL:  The patient was evaluated in the department lying supine in the  hospital bed.  Initially with tachypnea, however, on reexamination, her  respirations became less labored.  She was alert and able to answer  questions, though easily fatigued.  HEENT:  Head was normocephalic, atraumatic.  Extraocular muscles were  intact.  NECK:  Supple, nontender, no palpable thyromegaly or mass.  CARDIOVASCULAR:  Normal S1, S2 without S3, S4.  LUNGS:  Diffuse rhonchi and wheeze, most prominent in the left base.  ABDOMEN:  Obese, soft, nontender.  She did exhibit a midsternal scar.  LOWER EXTREMITIES:  Cool.  Peripheral pulses were palpable.  There was no  edema.  NEUROLOGIC:  She was Eutyhmic.  Her affect was stable.  She exhibited no  focal deficits on exam.   LABORATORY DATA:  Her sodium is 142, potassium, 3.2, chloride 103, CO2 23,  BUN 44, creatinine 2.3, total bili mildly up at 1.6, AST 190, ALT 72,  albumin 2.8.  Her EKG revealed sinus rhythm with left bundle-branch block.  The urinalysis revealed WBCs 11-20.  Her troponin was 2.51.  CK-MB was 80,  myoglobin greater than 500.  WBC is 19.1, hematocrit 30.9, MCV 97, platelet  count 204.  Chest x-ray revealed left lobe infiltrate.   ASSESSMENT:  1.  Acute left lower lobe pneumonia with hypoxic respiratory distress.  2.  Possible acute myocardial infarction without plans for intervention at      this time after discussion with the family and the patient. 3.  Urinary tract infection.  4.  Renal failure, unsure of  baseline creatinine.  5.  No history of coronary artery disease.  6.  History of hypertension.  7.  Obesity.  8.  Hypokalemia.  9.  No Code Blue  status.   PLAN:  After discussion with cardiology, the patient's brother and power of  attorney, it was determined that the patient would not be a candidate for  intervention at this time.  She has not complained of chest pain.  We are  going  to continue cycling monitors, follow her, and manage her medically as  indicated and await the pain living will as indicated by the patient's  brother, Carole Binning, who states that he will provide this to [place] and affirms  that she has requested no heroic efforts or resuscitative measures be  undertaken.      Hettie Holstein, D.O.  Electronically Signed     ESS/MEDQ  D:  11/18/2004  T:  11/18/2004  Job:  [id number]   cc:   Soyla Murphy. [name], M.D.  Fax: [phone]   Cristy Hilts. Jacinto Halim, MD  Fax: [phone]   Antionette Char, MD  Fax: [phone]

## 2010-06-13 NOTE — Op Note (Signed)
   NAME:  Virginia Herrera, Virginia Herrera                      ACCOUNT NO.:  [id number]   MEDICAL RECORD NO.:  [id number]                   PATIENT TYPE:  AMB   LOCATION:  ENDO                                 [facility]ACILITY[facility]   PHYSICIAN:  Georgiana Spinner, M.D.                 DATE OF BIRTH:  [birth year]   DATE OF PROCEDURE:  11/04/2001  DATE OF DISCHARGE:                                 OPERATIVE REPORT   PROCEDURE:  Upper endoscopy.   INDICATIONS:  Heme-positivity.   ANESTHESIA:  Demerol 30, Versed 3 mg.   DESCRIPTION OF PROCEDURE:  With patient mildly sedated in the left lateral  decubitus position, the Olympus videoscopic endoscope inserted in the mouth  and passed under direct vision through the esophagus, which appeared normal,  into the stomach.  Fundus, body, antrum, duodenal bulb, and second portion  of duodenum appeared normal.  From this point, the endoscope was slowly  withdrawn, taking circumferential views of the duodenal mucosa until the  then endoscope pulled back into the stomach, placed in retroflexion to view  the stomach from below.  The endoscope was then straightened and withdrawn,  taking circumferential views of the remaining gastric and esophageal mucosa.  The patient's vital signs and pulse oximeter remained stable.  The patient  tolerated the procedure well without apparent complications.   FINDINGS:  This is an unremarkable endoscopic examination.   PLAN:  Proceed to colonoscopy.                                               Georgiana Spinner, M.D.    GMO/MEDQ  D:  11/04/2001  T:  11/04/2001  Job:  [id number]

## 2010-06-13 NOTE — H&P (Signed)
NAME:  Virginia Herrera, Virginia Herrera NO.:  [id number]   MEDICAL RECORD NO.:  [id number]          PATIENT TYPE:  INP   LOCATION:  0102                         FACILITY:  [facility]   PHYSICIAN:  Hettie Holstein, D.O.    DATE OF BIRTH:  1911/06/13   DATE OF ADMISSION:  11/18/2004  DATE OF DISCHARGE:                                HISTORY & PHYSICAL   PRIMARY CARE PHYSICIAN:  Dr. Merri Brunette   CARDIOLOGIST:  Dr. Aleen Campi   Dictation ended at this point.      Hettie Holstein, D.O.  Electronically Signed     ESS/MEDQ  D:  11/18/2004  T:  11/18/2004  Job:  [id number]

## 2010-06-13 NOTE — Discharge Summary (Signed)
. [facility]  Patient:    Virginia Herrera, Virginia Herrera                   MRN: [id number] Adm. Date:  [id number] Disc. Date: 07/04/99 Attending:  Charlett Lango Dictator:   Sherrie George, P.A. CC:         Salvatore Decent. Dorris Fetch, M.D.             John R. Aleen Campi, M.D.             Soyla Murphy. [name], M.D.             [facility] with the patient tomorrow                           Discharge Summary  DATE OF BIRTH:  1911-01-27  ADMISSION DIAGNOSES: 1. Congestive heart failure with recent myocardial infarction and    out-of-hospital cardiac arrest. 2. Known first degree AV block. 3. Aspiration pneumonia with prior admission March 2001.  DISCHARGE DIAGNOSES: 1. Severe three-vessel coronary artery disease with 95% left main disease. 2. Postoperative anemia secondary to blood loss. 3. Postoperative atrial fibrillation and flutter/AV block. 4. Mild cellulitis at the venectomy site.  PROCEDURES: 1. Cardiac catheterization, Jun 25, 1999, John R. Tysinger, M.D. 2. Coronary artery bypass grafting x 4, left internal mammary to the LAD;    saphenous vein graft to the posterior descending, posterolateral    sequentially; and saphenous vein graft to the OM, Jun 26, 1999.  BRIEF HISTORY OF PRESENT ILLNESS:  The patient is an 77 year old white female, a medical patient of Soyla Murphy. [name], M.D., who was admitted by Dr. [name] on Jun 24, 1999, with complaints of shortness of breath. She had an episode of syncope. It is unclear whether she actually had a cardiac arrest, but CPR was performed at the site. In the emergency room, she was found to be in a sinus rhythm with some PVCs. She had some SVT which was controlled with Cardizem. She also had some problems with aspiration pneumonia that was treated with Levaquin. VQ scan was also examined during that hospitalization and found to be negative. This prior hospitalization was from March 20 to April 25, 1999. Over the last several days prior to her admission, she developed shortness of breath. She was found to be hypoxic and brought to the emergency room. Here EKG showed no acute MI. She was in a sinus rhythm with some PVCs and axis changes, and it was decided to admit her for further evaluation. Her admission diagnosis was congestive heart failure with recent history of myocardial infarction.  HOSPITAL COURSE:  She was evaluated by the cardiology service and underwent cardiac catheterization on Jun 25, 1999, by Aram Candela. Aleen Campi, M.D. He found that the patient had a 95 to 99% left main, a 30 to 40% LAD, a 70% right, and a 70% circumflex stenosis. The patient was referred to CVTS and Viviann Spare C. Dorris Fetch, M.D. who after evaluating the patient agreed the patient was a good candidate for coronary artery bypass grafting. The risks and benefits were discussed in detail, and informed operative consent was obtained. She was taken to the operating room on Jun 26, 1999. At which she time, she underwent coronary artery bypass grafting x 4 left internal mammary artery to the LAD, sequential saphenous vein to the posterior descending and posterolateral, and saphenous vein to the OM. The patient tolerated the procedure  well and returned to the intensive care unit in satisfactory condition. She remained hemodynamically stable with a cardiac index at 2.31 postoperatively and was extubated. The patient was hemodynamically stable first postoperative morning and was transferred to 2000. The patient was rather slow to mobilize secondary to her age; but overall, she has had a very benign course. She has had some postoperative atrial fibrillation and some flutter. She was AV paced for a short time. Her hemoglobin was down to 8 postoperatively secondary to blood loss during the procedure but has slowly improved. She continued to make progress; and by July 03, 1999, it was Dr. Rondel Jumbo opinion that she  could probably go home in the a.m. She has some mild erythema in her venectomy site, and she continues to have a fair amount of edema in her venectomy site. She has been on daily diuresis and has continued to make good progress from this point. She is walking with a roller walker, mostly for stability and her self-confidence. Her O2 saturations are 90s and 96% on room air; blood pressures are between 130 and 150/60 to 70 range. Her wounds are all healing well. We will have all her sutures removed prior to discharge.  DISCHARGE LABORATORY DATA:  White count is 9800, hemoglobin is 9.6, hematocrit is 29.6, platelets are 353,000. Sodium is 137, potassium is 4.3, chloride is 98, CO2 is 34, glucose 122, BUN of 19, creatinine 1.1, magnesium is 2.1, and calcium is 8.9  DISCHARGE MEDICATIONS: 1. Ecotrin 325 mg one q.d. 2. Cardizem CD 120 mg q.d. 3. Toprol XL 25 mg q.d. 4. Protonix 40 mg q.d. 5. Potassium chloride 20 mEq one q.d. 6. Lasix 40 mg q.d. 7. Combivent 2 puffs p.o. q.i.d. 8. Ultram 50 mg one to two p.o. q.4h p.r.n. 9. She can also have Tylenol one to two p.o. q.4h. p.r.n.  CONDITION ON DISCHARGE:  Improving.  FOLLOW-UP:  She will return to see Aram Candela. Tysinger, M.D. in two weeks with a chest x-ray in his office and follow up with Viviann Spare C. Dorris Fetch, M.D. in three weeks with a chest x-ray from Dr. Star Age office. She is to follow up with Soyla Murphy. [name], M.D. as instructed. DD:  07/03/99 TD:  07/03/99 Job: [id number]
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
## 2010-06-13 NOTE — Discharge Summary (Signed)
Virginia Herrera, LANGSAM            ACCOUNT NO.:  [id number]   MEDICAL RECORD NO.:  [id number]          PATIENT TYPE:  INP   LOCATION:  0164                         FACILITY:  [facility]   PHYSICIAN:  Hillery Aldo, M.D.   DATE OF BIRTH:  04-Aug-1911   DATE OF ADMISSION:  11/18/2004  DATE OF DISCHARGE:                                 DISCHARGE SUMMARY   PRIMARY CARE PHYSICIAN:  Merri Brunette, M.D.   CARDIOLOGIST:  Charolette Child, MD   DISCHARGE DIAGNOSES:  1.  Acute myocardial infarction.  2.  Congestive heart failure exacerbation.  3.  Left lower lobe pneumonia.  4.  Hypoxic respiratory failure.  5.  Hypothyroidism in the setting of a normal free T4.  6.  Hypertension.  7.  Chronic kidney disease/acute renal failure.  8.  Diabetes.  9.  Anxiety.  10. Anemia.   DISCHARGE MEDICATIONS:  Will be dictated at the time of actual discharge as  we are continuing to titrate her beta blocker and angiotensin receptor  blocker.   CONSULTATIONS:  Dr. Aleen Campi of cardiology.   PROCEDURES AND DIAGNOSTIC STUDIES:  1.  Chest x-ray on November 18, 2004 showed cardiomegaly with left lung      infiltrate favoring infection. There was asymmetric pulmonary edema also      in the differential.  2.  Repeat chest x-ray on November 21, 2004 showed improved bilateral      aeration/edema with continued left lower lung consolidation/atelectasis.  3.  A 2D echocardiogram on November 19, 2004 showed moderately decreased left      ventricular function with an ejection fraction of 30-40%, mild diffuse      left ventricular hypokinesis and moderate hypokinesis of the anterior      wall. The aortic valve thickness was moderately increased. No specific      significant stenosis by Doppler. There was mild to moderate mitral      anular calcification. No obvious stenosis.  4.  Chest x-ray on November 24, 2004 showed low volume to slight increased      right base atelectasis and no change on the left.   CURRENT  LABORATORY VALUES:  Hemoglobin is 10.9, hematocrit 32, white blood  cell count 8.9, platelets 270. Sodium 139, potassium 3.0, chloride 95,  bicarb 35, BUN 34, creatinine 1.3, glucose 102. BNP is 417.   BRIEF ADMISSION HISTORY OF PRESENT ILLNESS:  The patient is a 77 year old  female who was living independently prior to admission and came in with a  known history of coronary artery disease status post CABG in July 2001 who  presented to the emergency department with a cough that was progressive over  previous week prior to admission. She had some episodes of confusion. She  was found on the floor at home after several calls were made to her home and  there was no answer. She was initially hypoxic and incontinent of urine. She  was transported to the emergency department for further evaluation and  workup.   HOSPITAL COURSE:  Problem 1.  Altered mental status secondary to hypoxic  respiratory failure secondary to acute myocardial infarction and  left lower  lobe pneumonia. The patient was admitted to the ICU and monitored closely.  Cardiac enzymes were cycled. They were positive and so cardiac consultation  was obtained. Her mental status improved with underlying treatment of her  acute MI and left lower lobe pneumonia.   Problem 2.  Left lower lobe pneumonia. The patient was admitted and given  empiric antibiotic therapy in the emergency department consisting of a dose  of Rocephin and azithromycin. The initial chest x-ray reading showed  asymmetric edema versus left lower lobe infiltrate. Her antibiotics were not  initially continued. On hospital day #3 it was felt that the left lower lobe  infiltrate did in fact represent a pneumonia, and she was restarted on  Rocephin and azithromycin. She was also placed on Mucinex nebulizer therapy  for ongoing problems with cough. Her white blood cell count was elevated to  a high of 15.6. This rapidly declined with the re-institution of  antibiotics  and has now normalized to 8.9. She has completed a total course of 6 days of  therapy with Rocephin and azithromycin. Her antibiotics will be changed to  p.o. Avelox. She should complete a 10-day course of antibiotic therapy  total. Her chest x-ray has lagged behind in terms of showing improvement. We  will continue to monitor this. She continues to have a significant cough  which we are treating with increased mobility, aggressive pulmonary toilet  and antitussives p.r.n.   Problem 3.  Acute nontransmural myocardial infarction in the setting of  coronary artery disease and congestive heart failure exacerbation. The  patient's BNP was quite elevated to a high of 3200. With some treatment of  her underlying condition this declined. Her diuretics were titrated up. Her  BNP has further declined to 417. We will continue to diurese her and  maximize her medical therapy including aspirin, beta blockade, angiotensin  receptor blockade, diuretics. Note: Dr. Aleen Campi did follow her in the  hospital and added a Statin therapy although her fasting lipid panel did not  show hypercholesterolemia. Cardiology recommended continued conservative  therapy.   Problem 4.  Hyperthyroidism. The patient's TSH was checked and found to be  slightly low at 0.197. Her free T4 was normal at 1.20. She should have a  followup check of her thyroid function in 4-6 weeks' time.   Problem 5.  Chronic kidney disease/acute renal failure. The patient does  have significant renal impairment with a creatinine clearance of  approximately 32 mL/minute. Her creatinine was initially 2.2. It has come  down to 1.3 and has remained stable despite increasing the dose of diuretic.  Given this her angiotensin receptor blocker was re-added, and her creatinine  should be continue to be monitored closely.   Problem 6.  Diabetes. The patient's hemoglobin A1c was checked and found to be 5.6. She had good glycemic control  with up titration of Lantus for basal  Insulin coverage and sliding scale p.r.n.   Problem 7.  Anxiety. The patient was started on Zoloft for significant  anxiety. This can be titrated up p.r.n.   Problem 8.  Hypertension. The patient's antihypertensives were adjusted and  will undergo further dose titration if needed. A final summary of her  discharge medications will be updated and provided at the actual time of the  patient's discharge.   DISPOSITION:  The patient will be transferred to a skilled nursing facility  once she is medically stable.           ______________________________  Hillery Aldo, M.D.     CR/MEDQ  D:  11/25/2004  T:  11/25/2004  Job:  [id number]   cc:   Soyla Murphy. [name], M.D.  Fax: [phone]   Antionette Char, MD  Fax: [phone]

## 2010-06-13 NOTE — Op Note (Signed)
[place]. [facility]  Patient:    Virginia Herrera, Virginia Herrera                   MRN: [id number] Proc. Date: 06/26/99 Adm. Date:  [id number] Attending:  Charlett Lango CC:         Aram Candela. Aleen Campi, M.D.                           Operative Report  PREOPERATIVE DIAGNOSIS:   A 99% left main stenosis.  POSTOPERATIVE DIAGNOSIS:  A 99% left main stenosis.  PROCEDURE:  Median sternotomy, extracorporeal circulation, coronary artery bypass grafting x 4 (left internal mammary artery to left anterior descending artery, saphenous vein graft to first obtuse marginal, saphenous vein graft to posterior descending and posterolateral.)  SURGEON:  Viviann Spare C. Dorris Fetch, M.D.  ASSISTANT:  Maple Mirza  ANESTHESIA:  General  FINDINGS:  LIMA and saphenous vein:  Good quality.  LAD, OM1 and PL:  Good quality targets.  PDA:  Poor quality target.  CLINICAL NOTE:  The patient is an 77 year old female who has a history of congestive heart failure, supraventricular tachycardias.  She presented with unstable angina.  Cardiac catheterization was performed, which revealed a 99% left main stenosis, as well as other significant coronary blockages.  The patient was refereed for coronary artery bypass grafting.  The indications, risks, benefits and alternatives for the procedure were discussed in detail with the patient.  She understood the risks, accepted them and agreed to proceed.  DESCRIPTION OF PROCEDURE:  The patient was brought to the preoperative holding rea on Jun 26, 1999.  Lines were placed to monitor arterial central venous and pulmonary arterial pressure.  EKG leads were placed for continuous telemetry. he patient was taken to the operating room, anesthetized and intubated.  A Foley catheter was placed, IV antibiotics were administered.  The chest, abdomen and egs were prepped and draped in the usual fashion.  A median sternotomy was performed.  Simultaneously,  incision was made in the medial aspect of the right leg and greater saphenous vein was harvested from the ankle to the mid-thigh. Saphenous vein was of good quality. The left internal mammary artery was harvested.  This was difficult secondary to the patients body habitus, however, the mammary artery was a 2 mm good quality conduit with excellent flow  through the cut end of the vessel. The mammary was placed in a papaverine-soaked sponge after ensuing that all branches were clipped and placed into the left pleural space.  The pericardium was opened, and the ascending aorta was inspected. It was of normal size.  There was no palpable atherosclerotic disease. The aorta was cannulated via concentric 2-0 Ethibond pledgetted pursestring sutures.  A dual-stage venous cannula was placed via pursestring suture in the right atrium. Cardiopulmonary bypass was instituted, and the patient was cooled to 32 degrees  Celsius.  The coronary arteries were inspected and anastomotic sites were chosen. The conduits were inspected and cut to length. Foam pad was placed in the pericardium to protect the left phrenic nerve. A temperature probe was placed into the myocardial septum and a cardioplegic cannula was placed in the ascending aorta.  The aorta was cross-clamped and the left ventricle was entered via the aortic root vent.  Cardiac arrest was achieved with a combination of cold antegrade blood cardioplegia and topical iced saline. After achieving complete diastolic arrest and myocardial septum temperature of 13 degrees Celsius, the following distal  anastomoses were performed:  First, a reverse saphenous vein graft was placed sequentially to the posterior descending and posterolateral branches of the right coronary. The posterior descending was a poor quality.  It was diffusely diseased, but was elected to graft into a relatively disease-free segment.  Only a 1 mm probe passed distally.   A side-to-side anastomosis was performed to the posterior descending using a running 7-0 Prolene suture.  There was good flow through the anastomosis.  An end-to-side anastomosis was then performed using the same segment of saphenous vein to the posterolateral branch of the right coronary.  This was a 1.5 mm good quality vessel.The anastomosis was performed with a running 7-0 Prolene suture, and was  again probed proximally and distally, and there was excellent flow through the graft.  Cardioplegia was administered down the vein graft. There was good hemostasis at both anastomoses.  Additional cardioplegia also was given via the  aortic root.  Next, a reverse saphenous vein graft was placed end-to-side to the first obtuse  marginal branch of the left circumflex coronary artery. This was a heavily diseased vessel proximally but was of good quality at the site of the anastomosis.  It was 1.5 mm in diameter, and a probe passed easily distally in the vessel.  The saphenous vein graft was of large caliber but good quality.  The anastomosis was performed end-to-side using a running 7-0 Prolene suture.  There was excellent low through the graft. At the completion of the anastomosis, additional cardioplegia was administered down both vein grafts.  Next, the left internal mammary artery was spatulated at its distal end.  It was anastomosed end-to-side to the distal LAD.  The LAD was a 1.5 good quality vessel at the site of the anastomosis.  The mammary artery was a 2 mm excellent quality graft. The anastomosis was performed end-to-side using a running 8-0 Prolene suture.   At the completion of the mammary-to-LAD anastomosis, the bulldog clamp was briefly removed from the mammary to inspect for hemostasis.  The bulldog clamp thimbles were placed, and additional cardioplegia was administered via the aortic root and via the vein grafts.  The vein grafts then were cut to length.  Then  4.4 mm punch aortotomies were made in the ascending aorta. The proximal vein graft anastomoses were performed under cross-clamp using running 6-0 Prolene sutures.  At the completion of the final  proximal anastomosis before tying the suture, the patient was placed in Trendelenburg position, lidocaine was administered.  The bulldog was released from the mammary pedicle.  Immediate and rapid septal rewarming was noted. The aortic root was allowed to fill with blood.  The cross-clamp was removed.  Total cross-clamp time was 62 minutes. Air was aspirated from the vein grafts.  The bulldog clamps were removed and flow was restored.  All proximal and distal anastomoses were inspected for hemostasis.  A single defibrillation with 20 joules was required before the patient resumed spontaneous rhythm.  All proximal and distal anastomoses were inspected for hemostasis.  The patient was rewarmed.  Epicardial pacing wires were placed on the right ventricle and right  atrium.  The patient was DDD paced for rate.  When the core temperature reached 37 degrees Celsius, the patient was weaned from bypass.  The lungs were infiltrated, the heart was gradually allowed to fill with blood.  The bump flows were gradually decreased, until the patient was taking over circulation on her own.  She was weaned from bypass without difficulty.  Initial cardiac  index was 1.8 L/minute.  The patient then had a drop in her blood pressure and some distention of her heart. It was noted that there was air in the circumflex vein graft.  Both vein grafts  once again were aspirated to remove any residual air.  Neo-Synephrine was used o increase the coronary perfusion pressure. After several minutes, the blood pressure recovered.  The patient was started on a low dose dopamine drip at this time. he patient then had excellent cardiac function and good hemodynamics, and cardiac index was greater than 2 L/minute.  The  patient was kept in Trendelenburg until it was certain there were no further air bubbles.  A test does of protamine was administered and was well tolerated.  The atrial and aortic cannulae were removed.  There was good hemostasis at both cannulation sites. The remainder of the protamine was administered without incident and additional  dose of antibiotics was administered.  The chest was irrigated with 1 L of warm  normal saline containing 1 g of vancomycin.  Hemostasis was achieved. The pericardium was not closed.  A left pleural and two mediastinal chest tubes were placed through separate subcostal incisions.  The patient had large, bilateral pleural effusions which were drained.  The sternum was closed with a combination of simple and figure-of-eight heavy gauge stainless steel wires.  The pectoralis fascia was closed with a running #1 Vicryl suture. Subcutaneous tissue was closed with a running 2-0 Vicryl suture.  Skin was closed with staples. All sponge, instrument and needle counts were correct at the end of the procedure.  There were no intraoperative complications.  The patient was taken from the operating room to the surgical intensive care unit, intubated in stable condition. DD:  06/26/99 TD:  06/30/99 Job: [id number]

## 2010-06-13 NOTE — Consult Note (Signed)
[facility]  Patient:    Virginia Herrera, Virginia Herrera                   MRN: [id number] Proc. Date: 06/24/99 Adm. Date:  [id number] Attending:  Londell Moh Dictator:   Donzetta Matters, P.A.                          Consultation Report  DATE OF BIRTH:  12/07/1911  CHIEF COMPLAINT:  Trouble breathing.  HISTORY OF PRESENT ILLNESS:  This is an 77 year old female who gives a history f having an out of facility arrest on April 15, 1999.  She was discharged from the hospital on April 25, 1999 to a nursing facility.  She was there until May 06, 1999, and is presently in her own home.  She states she had driven her car to [place] over Mothers Day with a nephew to a grand nephews graduation.  They returned to the [place] part of the state to visit with relatives.  She returned to the nephews for [facility] Day weekend.  She came home on Jun 23, 1999.  She given a history of, over the past 3-4 days having to sit up in order to breath easily. She states that she has not missed any of her medications but, this morning, woke up with more dyspnea than usual and, therefore, came to the emergency room.  She states that she has lost 10 pounds since March.  There has been no significant weight gain, no puffy fingers.  She denies any diaphoresis or nausea.  This morning, she was worse with shortness of breath and, therefore, came to the emergency room. DD:  06/24/99 TD:  06/25/99 Job: [id number]

## 2010-10-21 LAB — POCT I-STAT, CHEM 8
BUN: 37 — ABNORMAL HIGH
Calcium, Ion: 1.21
Chloride: 107
Creatinine, Ser: 1.6 — ABNORMAL HIGH
Glucose, Bld: 110 — ABNORMAL HIGH
HCT: 47 — ABNORMAL HIGH
Hemoglobin: 16 — ABNORMAL HIGH
Potassium: 3.7
Sodium: 143
TCO2: 29

## 2010-10-21 LAB — DIFFERENTIAL
Basophils Absolute: 0
Basophils Relative: 0
Eosinophils Absolute: 0
Eosinophils Relative: 0
Lymphocytes Relative: 20
Lymphocytes Relative: 8 — ABNORMAL LOW
Lymphs Abs: 1.2
Monocytes Absolute: 0.8
Monocytes Absolute: 0.8
Monocytes Relative: 5
Monocytes Relative: 8
Neutro Abs: 13.3 — ABNORMAL HIGH
Neutro Abs: 6.5
Neutrophils Relative %: 86 — ABNORMAL HIGH

## 2010-10-21 LAB — URINALYSIS, ROUTINE W REFLEX MICROSCOPIC
Bilirubin Urine: NEGATIVE
Glucose, UA: NEGATIVE
Ketones, ur: NEGATIVE
Leukocytes, UA: NEGATIVE
Nitrite: NEGATIVE
Protein, ur: 30 — AB
Specific Gravity, Urine: 1.016
Urobilinogen, UA: 0.2
pH: 5.5

## 2010-10-21 LAB — CK TOTAL AND CKMB (NOT AT ARMC)
CK, MB: 25.6 — ABNORMAL HIGH
CK, MB: 35.3 — ABNORMAL HIGH
CK, MB: 5.1 — ABNORMAL HIGH
Relative Index: 0.5
Relative Index: 0.6
Relative Index: 0.7
Total CK: 1085 — ABNORMAL HIGH
Total CK: 5175 — ABNORMAL HIGH

## 2010-10-21 LAB — CBC
HCT: 36.6
HCT: 37.4
HCT: 44.7
Hemoglobin: 12.6
Hemoglobin: 13.1
Hemoglobin: 15
MCHC: 33.6
MCV: 92.5
MCV: 92.8
MCV: 93.6
Platelets: 134 — ABNORMAL LOW
Platelets: 166
RBC: 4.15
RBC: 4.84
RDW: 13.1
RDW: 13.4
WBC: 11.3 — ABNORMAL HIGH
WBC: 15.4 — ABNORMAL HIGH
WBC: 9.6

## 2010-10-21 LAB — BASIC METABOLIC PANEL
CO2: 26
Calcium: 8.3 — ABNORMAL LOW
Calcium: 8.8
Chloride: 106
Chloride: 108
Creatinine, Ser: 1.33 — ABNORMAL HIGH
GFR calc Af Amer: 45 — ABNORMAL LOW
GFR calc Af Amer: 54 — ABNORMAL LOW
GFR calc non Af Amer: 42 — ABNORMAL LOW
GFR calc non Af Amer: 44 — ABNORMAL LOW
Glucose, Bld: 88
Glucose, Bld: 96
Potassium: 3.8
Sodium: 135
Sodium: 140

## 2010-10-21 LAB — CARDIAC PANEL(CRET KIN+CKTOT+MB+TROPI)
Total CK: 3172 — ABNORMAL HIGH
Total CK: 3369 — ABNORMAL HIGH
Troponin I: 0.13 — ABNORMAL HIGH

## 2010-10-21 LAB — PROTIME-INR: INR: 1

## 2010-10-21 LAB — POCT CARDIAC MARKERS
CKMB, poc: 30.8
Myoglobin, poc: >500
Operator id: 265201
Troponin i, poc: <0.05

## 2010-10-21 LAB — APTT: aPTT: 30

## 2010-10-21 LAB — URINE MICROSCOPIC-ADD ON

## 2010-10-21 LAB — HEMOGLOBIN A1C
Hgb A1c MFr Bld: 6.1
Mean Plasma Glucose: 140

## 2010-10-21 LAB — T3: T3, Total: 78.8 — ABNORMAL LOW (ref 80.0–204.0)

## 2010-10-21 LAB — TROPONIN I: Troponin I: 0.15 — ABNORMAL HIGH

## 2010-10-21 LAB — TSH: TSH: 0.322 — ABNORMAL LOW

## 2010-10-22 LAB — BASIC METABOLIC PANEL
BUN: 15
GFR calc Af Amer: >60
GFR calc non Af Amer: 50 — ABNORMAL LOW
Potassium: 3.7

## 2011-03-06 IMAGING — CR DG CHEST 2V
2 series · 2 of 2 positions shown · non-contrast
Comparison: [HOSPITAL] portable chest x-ray [DATE] 9007
6131 hours and [HOSPITAL] portable chest x-ray 11/18/2004
through 11/26/2004.

CLINICAL DATA: Fever, cough for 2 days.  CABG and pacemaker.

CHEST - 2 VIEW

[view not recorded (1 of 2)]
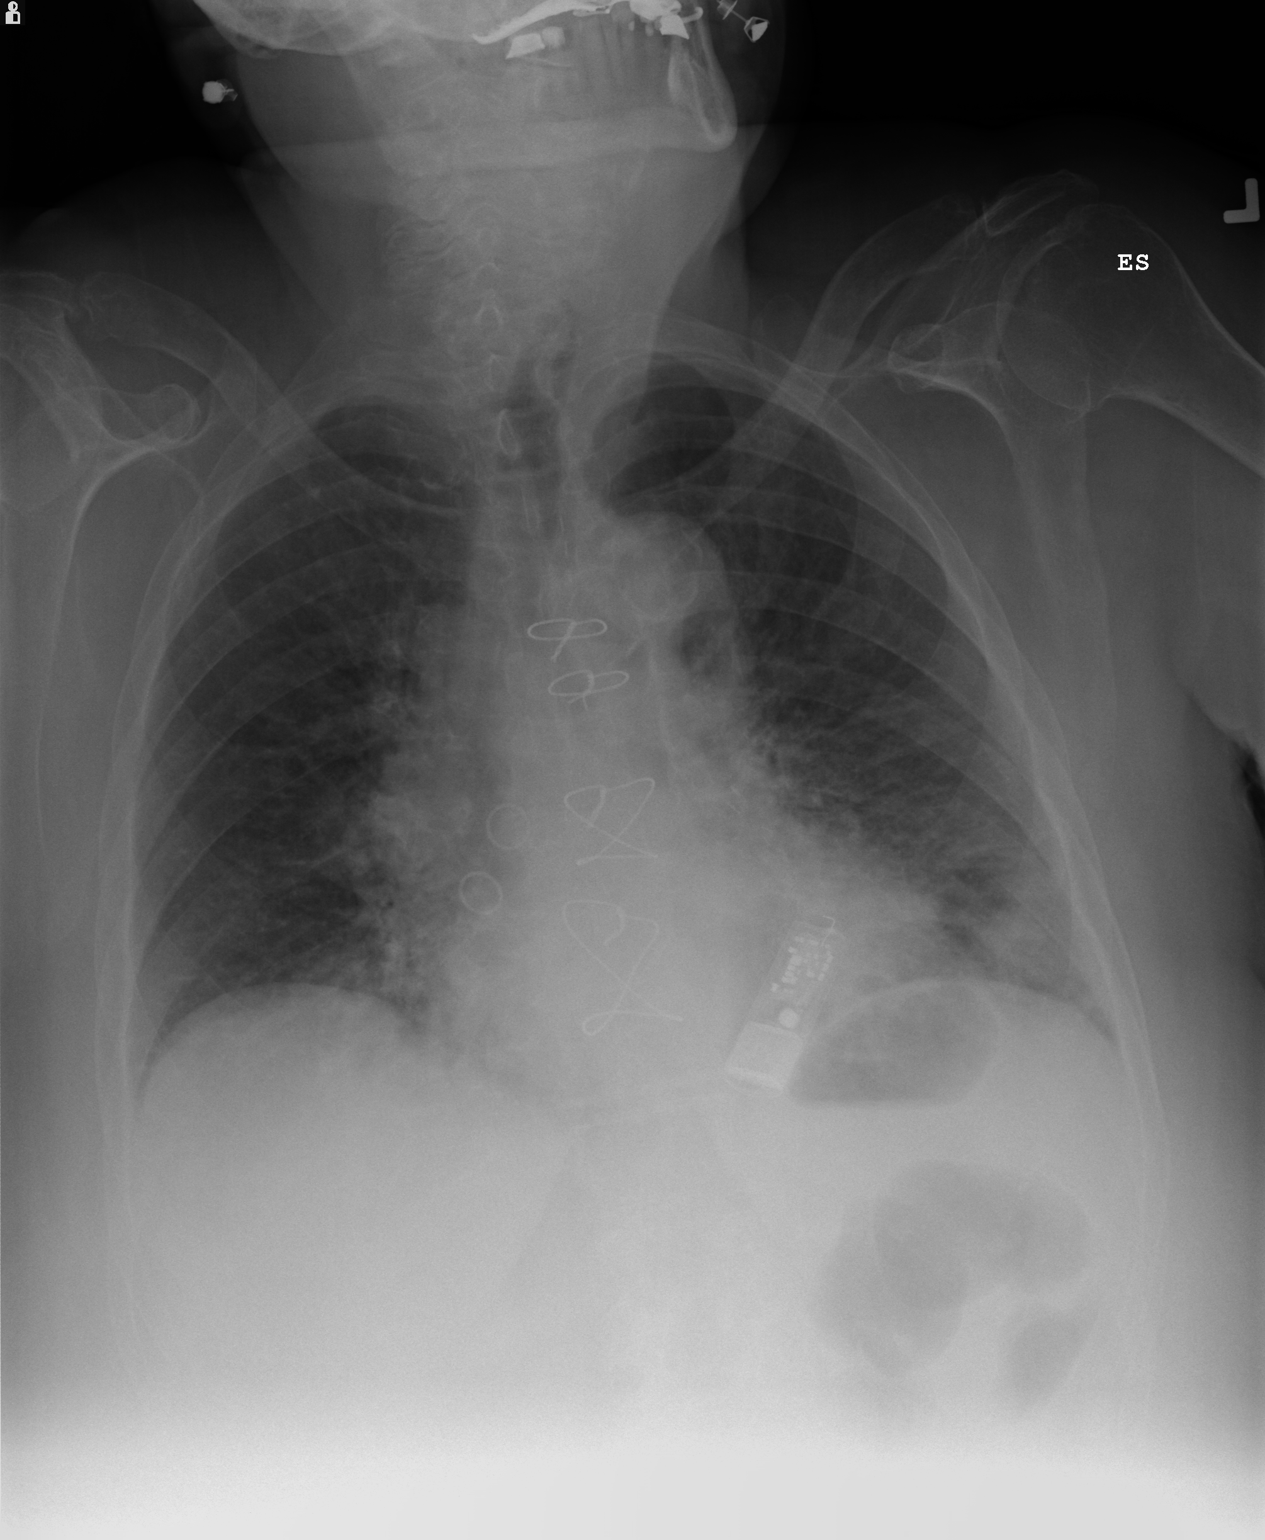

[view not recorded (2 of 2)]
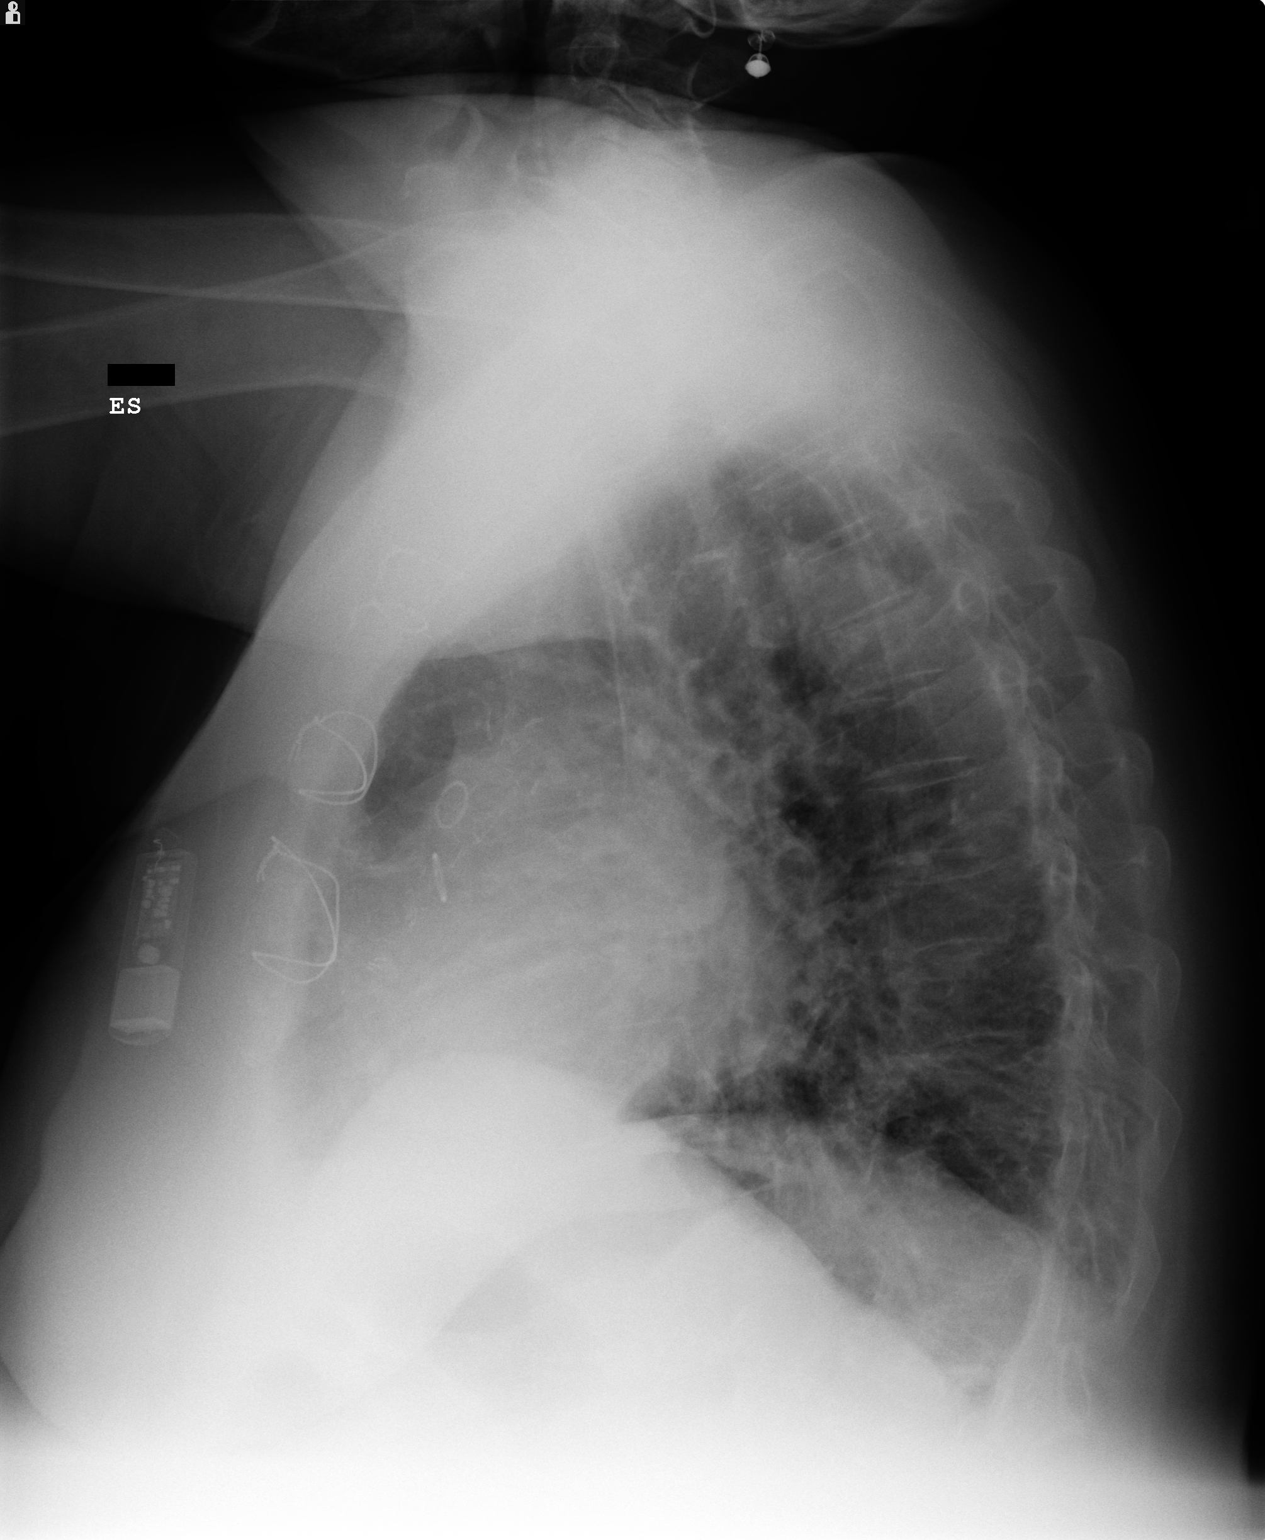

[2 of 2 positions shown; findings below may reference images not displayed]

FINDINGS: Peripheral pulmonary vascular congestion with diffuse
interstitial edema noted.  Stable slight cardiomegaly and post CABG
change seen.  No other new acute findings visualized.
IMPRESSION: 1.  Interval slight congestive heart failure findings.
2.  Stable slight cardiomegaly post CABG change.
3.  Otherwise no additional acute findings.

## 2013-01-03 ENCOUNTER — Ambulatory Visit (INDEPENDENT_AMBULATORY_CARE_PROVIDER_SITE_OTHER): Payer: Medicare Other | Admitting: Podiatry

## 2013-01-03 ENCOUNTER — Encounter: Payer: Self-pay | Admitting: Podiatry

## 2013-01-03 VITALS — BP 155/98 | HR 102 | Resp 16

## 2013-01-03 DIAGNOSIS — M79609 Pain in unspecified limb: Secondary | ICD-10-CM

## 2013-01-03 DIAGNOSIS — B351 Tinea unguium: Secondary | ICD-10-CM

## 2013-01-03 NOTE — Progress Notes (Signed)
   Subjective:    Patient ID: Virginia Herrera, female    DOB: [birth year], 79 y.o.   MRN: [id number]  HPI Comments: "My feet need to be taken care of"   Pt needs her toenails cut. They are long and thick.     Review of Systems  All other systems reviewed and are negative.       Objective:   Physical Exam: I have reviewed her past medical history medications and allergies. Vital signs are stable she is alert and oriented. Pulses are palpable bilateral. Neurologic sensorium is intact. Deep tendon reflexes are intact bilateral. Muscle strength + over 5 dorsiflexors plantar flexors inverters everters all intrinsic musculature is intact. Orthopedic evaluation demonstrates all joints distal to the ankle a full range of motion without crepitus no tenderness on palpation. Cutaneous evaluation demonstrates a dry xerotic skin dorsal aspect of the foot thick yellow dystrophic clinically mycotic and painful nails.        Assessment & Plan:  Assessment: Pain in limb secondary to onychomycosis.  Plan: Debridement of nails 1 through 5 bilateral covered service secondary to pain followup with her in 3 months.

## 2013-02-27 ENCOUNTER — Encounter (HOSPITAL_COMMUNITY): Payer: Self-pay | Admitting: Emergency Medicine

## 2013-02-27 ENCOUNTER — Emergency Department (HOSPITAL_COMMUNITY): Payer: Medicare Other

## 2013-02-27 ENCOUNTER — Emergency Department (HOSPITAL_COMMUNITY)
Admission: EM | Admit: 2013-02-27 | Discharge: 2013-02-27 | Disposition: A | Discharge status: Home / Self Care | Payer: Medicare Other | Attending: Emergency Medicine | Admitting: Emergency Medicine

## 2013-02-27 DIAGNOSIS — Z87891 Personal history of nicotine dependence: Secondary | ICD-10-CM | POA: Insufficient documentation

## 2013-02-27 DIAGNOSIS — N390 Urinary tract infection, site not specified: Secondary | ICD-10-CM | POA: Insufficient documentation

## 2013-02-27 LAB — CBC WITH DIFFERENTIAL/PLATELET
BASOS PCT: 0 % (ref 0–1)
Basophils Absolute: 0 10*3/uL (ref 0.0–0.1)
EOS ABS: 0 10*3/uL (ref 0.0–0.7)
EOS PCT: 0 % (ref 0–5)
HCT: 41.5 % (ref 36.0–46.0)
HEMOGLOBIN: 13.6 g/dL (ref 12.0–15.0)
LYMPHS ABS: 1.6 10*3/uL (ref 0.7–4.0)
Lymphocytes Relative: 13 % (ref 12–46)
MCH: 31.6 pg (ref 26.0–34.0)
MCHC: 32.8 g/dL (ref 30.0–36.0)
MCV: 96.5 fL (ref 78.0–100.0)
MONOS PCT: 6 % (ref 3–12)
Monocytes Absolute: 0.7 10*3/uL (ref 0.1–1.0)
NEUTROS PCT: 81 % — AB (ref 43–77)
Neutro Abs: 10.2 10*3/uL — ABNORMAL HIGH (ref 1.7–7.7)
Platelets: 132 10*3/uL — ABNORMAL LOW (ref 150–400)
RBC: 4.3 MIL/uL (ref 3.87–5.11)
RDW: 14.2 % (ref 11.5–15.5)
WBC: 12.6 10*3/uL — ABNORMAL HIGH (ref 4.0–10.5)

## 2013-02-27 LAB — BASIC METABOLIC PANEL
BUN: 26 mg/dL — AB (ref 6–23)
CALCIUM: 9.1 mg/dL (ref 8.4–10.5)
CO2: 29 mEq/L (ref 19–32)
CREATININE: 1.67 mg/dL — AB (ref 0.50–1.10)
Chloride: 101 mEq/L (ref 96–112)
GFR, EST AFRICAN AMERICAN: 28 mL/min — AB (ref >90)
GFR, EST NON AFRICAN AMERICAN: 24 mL/min — AB (ref >90)
GLUCOSE: 166 mg/dL — AB (ref 70–99)
POTASSIUM: 4.1 meq/L (ref 3.7–5.3)
Sodium: 142 mEq/L (ref 137–147)

## 2013-02-27 LAB — URINE MICROSCOPIC-ADD ON

## 2013-02-27 LAB — URINALYSIS, ROUTINE W REFLEX MICROSCOPIC
BILIRUBIN URINE: NEGATIVE
Glucose, UA: NEGATIVE mg/dL
KETONES UR: NEGATIVE mg/dL
NITRITE: POSITIVE — AB
PROTEIN: NEGATIVE mg/dL
Specific Gravity, Urine: 1.013 (ref 1.005–1.030)
UROBILINOGEN UA: 0.2 mg/dL (ref 0.0–1.0)
pH: 5.5 (ref 5.0–8.0)

## 2013-02-27 MED ORDER — SODIUM CHLORIDE 0.9 % IV BOLUS (SEPSIS)
1000.0000 mL | Freq: Once | INTRAVENOUS | Status: AC
  Administered 2013-02-27: 1000 mL via INTRAVENOUS

## 2013-02-27 MED ORDER — SODIUM CHLORIDE 0.9 % IV SOLN: INTRAVENOUS | Status: DC

## 2013-02-27 MED ORDER — CEPHALEXIN 500 MG PO CAPS
500.0000 mg | ORAL_CAPSULE | Freq: Once | ORAL | Status: AC
  Administered 2013-02-27: 500 mg via ORAL
  Filled 2013-02-27: qty 1

## 2013-02-27 MED ORDER — HYDROMORPHONE HCL PF 1 MG/ML IJ SOLN: 1.0000 mg | Freq: Once | INTRAMUSCULAR | Status: DC

## 2013-02-27 MED ORDER — CEPHALEXIN 500 MG PO CAPS: 500.0000 mg | ORAL_CAPSULE | Freq: Four times a day (QID) | ORAL | Status: AC

## 2013-02-27 NOTE — ED Notes (Signed)
Bed: [id number] Expected date: 02/27/13 Expected time: 8:27 AM Means of arrival:  Comments: 99 EMS

## 2013-02-27 NOTE — ED Notes (Signed)
Put O2 sensor on pt's ear. 02 sats running between 88 and 93%.

## 2013-02-27 NOTE — ED Provider Notes (Signed)
CSN: [id number]     Arrival date & time 02/27/13  [id number] History   First MD Initiated Contact with Patient 02/27/13 [phone]     Chief Complaint  Patient presents with  . Fatigue  . Fever   (Consider location/radiation/quality/duration/timing/severity/associated sxs/prior Treatment) Patient is a 80 y.o. female presenting with fever. The history is provided by the patient.  Fever  Virginia Herrera is a 80 y.o. female who presents for evaluation of lethargy while eating breakfast this morning. She is reported to have a low-grade temperature, by EMS. The patient is alert and denies problems. She does not seem to be a good historian. He denies recent fever, chills, cough, nausea, vomiting, weakness, or dizziness. There are no other known modifying factors.  History reviewed. No pertinent past medical history. History reviewed. No pertinent past surgical history. History reviewed. No pertinent family history. History  Substance Use Topics  . Smoking status: Former Games developermoker  . Smokeless tobacco: Not on file  . Alcohol Use: No   OB History   Grav Para Term Preterm Abortions TAB SAB Ect Mult Living                 Review of Systems  Constitutional: Positive for fever.  All other systems reviewed and are negative.    Allergies  Morphine and related and Vioxx  Home Medications   Current Outpatient Rx  Name  Route  Sig  Dispense  Refill  . cephALEXin (KEFLEX) 500 MG capsule   Oral   Take 1 capsule (500 mg total) by mouth 4 (four) times daily.   28 capsule   0    BP 120/50  Pulse 100  Temp(Src) 98.8 F (37.1 C) (Oral)  Resp 16  SpO2 92% Physical Exam  Nursing note and vitals reviewed. Constitutional: She is oriented to person, place, and time. She appears well-developed and well-nourished.  HENT:  Head: Normocephalic and atraumatic.  Eyes: Conjunctivae and EOM are normal. Pupils are equal, round, and reactive to light.  Neck: Normal range of motion and phonation normal. Neck  supple.  Cardiovascular: Normal rate, regular rhythm and intact distal pulses.   Pulmonary/Chest: Effort normal and breath sounds normal. She exhibits no tenderness.  Abdominal: Soft. She exhibits no distension. There is no tenderness. There is no guarding.  Musculoskeletal: Normal range of motion.  Neurological: She is alert and oriented to person, place, and time. She exhibits normal muscle tone.  Skin: Skin is warm and dry.  Psychiatric: She has a normal mood and affect. Her behavior is normal. Judgment and thought content normal.    ED Course  Procedures (including critical care time) Medications  sodium chloride 0.9 % bolus 1,000 mL (0 mLs Intravenous Stopped 02/27/13 1023)  cephALEXin (KEFLEX) capsule 500 mg (500 mg Oral Given 02/27/13 1143)    Patient Vitals for the past 24 hrs:  BP Temp Temp src Pulse Resp SpO2  02/27/13 1415 107/35 mmHg - - 55 17 91 %  02/27/13 1312 - - - - - 93 %  02/27/13 1248 106/45 mmHg - - 96 16 87 %  02/27/13 1139 120/50 mmHg 98.8 F (37.1 C) Oral 100 - 92 %  02/27/13 1045 113/47 mmHg - - 93 16 96 %  02/27/13 1030 113/33 mmHg - - 56 15 96 %  02/27/13 1015 103/45 mmHg - - 94 15 96 %  02/27/13 1000 117/38 mmHg - - 63 20 95 %  02/27/13 0945 105/41 mmHg - - 60 13 98 %  02/27/13 0931 96/33 mmHg - - 59 13 97 %  02/27/13 0910 80/33 mmHg 99.8 F (37.7 C) Rectal 59 18 91 %  02/27/13 0845 95/35 mmHg - - 64 16 94 %       Labs Review Labs Reviewed  CBC WITH DIFFERENTIAL - Abnormal; Notable for the following:    WBC 12.6 (*)    Platelets 132 (*)    Neutrophils Relative % 81 (*)    Neutro Abs 10.2 (*)    All other components within normal limits  BASIC METABOLIC PANEL - Abnormal; Notable for the following:    Glucose, Bld 166 (*)    BUN 26 (*)    Creatinine, Ser 1.67 (*)    GFR calc non Af Amer 24 (*)    GFR calc Af Amer 28 (*)    All other components within normal limits  URINALYSIS, ROUTINE W REFLEX MICROSCOPIC - Abnormal; Notable for the  following:    Color, Urine AMBER (*)    APPearance TURBID (*)    Hgb urine dipstick LARGE (*)    Nitrite POSITIVE (*)    Leukocytes, UA LARGE (*)    All other components within normal limits  URINE MICROSCOPIC-ADD ON - Abnormal; Notable for the following:    Bacteria, UA MANY (*)    Casts HYALINE CASTS (*)    All other components within normal limits  URINE CULTURE   Imaging Review No results found.  EKG Interpretation    Date/Time:  Monday February 27 2013 09:10:09 EST Ventricular Rate:  57 PR Interval:  299 QRS Duration: 127 QT Interval:  559 QTC Calculation: 544 R Axis:   -49 Text Interpretation:  Sinus or ectopic atrial rhythm Prolonged PR interval Left bundle branch block Baseline wander in lead(s) V2 Since last tracing PR has prolonged and there are new diffuse T wave inversions Confirmed by Mikiya Nebergall  MD, Alvar Malinoski ([id number]) on 02/27/2013 10:24:02 AM            MDM   1. UTI (lower urinary tract infection)    UTI without complicating features. Doubt Pyelonephritis, SBI or sepsis.    Nursing Notes Reviewed/ Care Coordinated Applicable Imaging Reviewed Interpretation of Laboratory Data incorporated into ED treatment  The patient appears reasonably screened and/or stabilized for discharge and I doubt any other medical condition or other [facility] requiring further screening, evaluation, or treatment in the ED at this time prior to discharge.  Plan: Home Medications- Keflex; Home Treatments- rest, fluids; return here if the recommended treatment, does not improve the symptoms; Recommended follow up- PCP 1 week    Flint Melter, MD 02/27/13 2053

## 2013-02-27 NOTE — Discharge Instructions (Signed)
Get plenty of rest, and drink a lot of fluids. See, your doctor in one week for a repeat urine test. Return here, if needed, for problems.   Urinary Tract Infection Urinary tract infections (UTIs) can develop anywhere along your urinary tract. Your urinary tract is your body's drainage system for removing wastes and extra water. Your urinary tract includes two kidneys, two ureters, a bladder, and a urethra. Your kidneys are a pair of bean-shaped organs. Each kidney is about the size of your fist. They are located below your ribs, one on each side of your spine. CAUSES Infections are caused by microbes, which are microscopic organisms, including fungi, viruses, and bacteria. These organisms are so small that they can only be seen through a microscope. Bacteria are the microbes that most commonly cause UTIs. SYMPTOMS  Symptoms of UTIs may vary by age and gender of the patient and by the location of the infection. Symptoms in young women typically include a frequent and intense urge to urinate and a painful, burning feeling in the bladder or urethra during urination. Older women and men are more likely to be tired, shaky, and weak and have muscle aches and abdominal pain. A fever may mean the infection is in your kidneys. Other symptoms of a kidney infection include pain in your back or sides below the ribs, nausea, and vomiting. DIAGNOSIS To diagnose a UTI, your caregiver will ask you about your symptoms. Your caregiver also will ask to provide a urine sample. The urine sample will be tested for bacteria and white blood cells. White blood cells are made by your body to help fight infection. TREATMENT  Typically, UTIs can be treated with medication. Because most UTIs are caused by a bacterial infection, they usually can be treated with the use of antibiotics. The choice of antibiotic and length of treatment depend on your symptoms and the type of bacteria causing your infection. HOME CARE  INSTRUCTIONS  If you were prescribed antibiotics, take them exactly as your caregiver instructs you. Finish the medication even if you feel better after you have only taken some of the medication.  Drink enough water and fluids to keep your urine clear or pale yellow.  Avoid caffeine, tea, and carbonated beverages. They tend to irritate your bladder.  Empty your bladder often. Avoid holding urine for long periods of time.  Empty your bladder before and after sexual intercourse.  After a bowel movement, women should cleanse from front to back. Use each tissue only once. SEEK MEDICAL CARE IF:   You have back pain.  You develop a fever.  Your symptoms do not begin to resolve within 3 days. SEEK IMMEDIATE MEDICAL CARE IF:   You have severe back pain or lower abdominal pain.  You develop chills.  You have nausea or vomiting.  You have continued burning or discomfort with urination. MAKE SURE YOU:   Understand these instructions.  Will watch your condition.  Will get help right away if you are not doing well or get worse. Document Released: 10/22/2004 Document Revised: 07/14/2011 Document Reviewed: 02/20/2011 [facility] Hospital[facility] Patient Information 2014 [place], [place].

## 2013-02-27 NOTE — ED Notes (Signed)
Called [facility] for transport

## 2013-02-27 NOTE — ED Notes (Addendum)
Pt from [facility]. Staff reports pt was more lethargic at breakfast than usual. EMS reports fever of 100.75F. EMS gave 1000mg  acetaminophen en route. Pt reports that she has had a cough for a while. Denies any other symptoms.

## 2013-02-27 NOTE — ED Notes (Signed)
Pt ambulated with walker without difficulty.

## 2013-03-01 LAB — URINE CULTURE: Colony Count: >=100000

## 2013-03-04 ENCOUNTER — Telehealth (HOSPITAL_COMMUNITY): Payer: Self-pay | Admitting: Emergency Medicine

## 2013-03-04 NOTE — ED Notes (Signed)
Post ED Visit - Positive Culture Follow-up  Culture report reviewed by antimicrobial stewardship pharmacist: []  Wes Dulaney, Pharm.D., BCPS []  Celedonio MiyamotoJeremy Frens, Pharm.D., BCPS []  Georgina PillionElizabeth Martin, [address] BoulevardPharm.D., BCPS [x]  WinamacMinh Pham, VermontPharm.D., BCPS, AAHIVP []  Estella HuskMichelle Turner, Pharm.D., BCPS, AAHIVP  Positive urine culture Treated with Keflex, organism sensitive to the same and no further patient follow-up is required at this time.  Zeb ComfortHolland, Jolyn Deshmukh 03/04/2013, 9:49 AM

## 2013-04-04 ENCOUNTER — Encounter: Payer: Medicare Other | Admitting: Podiatry

## 2013-05-02 ENCOUNTER — Ambulatory Visit: Payer: Medicare Other | Admitting: Podiatry

## 2013-05-05 ENCOUNTER — Ambulatory Visit (INDEPENDENT_AMBULATORY_CARE_PROVIDER_SITE_OTHER): Payer: Medicare Other | Admitting: Podiatry

## 2013-05-05 DIAGNOSIS — B351 Tinea unguium: Secondary | ICD-10-CM

## 2013-05-05 DIAGNOSIS — M79609 Pain in unspecified limb: Secondary | ICD-10-CM

## 2013-05-05 NOTE — Progress Notes (Signed)
She presents today with a chief complaint of painful toenails one through 5 bilateral.  Objective: Nails are thick yellow dystrophic with mycotic and painful palpation.  Assessment: Pain in limb secondary to onychomycosis.

## 2013-06-15 NOTE — Progress Notes (Signed)
This encounter was created in error - please disregard.

## 2013-08-15 ENCOUNTER — Ambulatory Visit: Payer: Medicare Other | Admitting: Podiatry

## 2014-02-01 ENCOUNTER — Other Ambulatory Visit (HOSPITAL_COMMUNITY): Payer: Self-pay | Admitting: Geriatric Medicine

## 2014-02-01 DIAGNOSIS — R0989 Other specified symptoms and signs involving the circulatory and respiratory systems: Secondary | ICD-10-CM

## 2014-02-02 ENCOUNTER — Ambulatory Visit (HOSPITAL_COMMUNITY)
Admission: RE | Admit: 2014-02-02 | Discharge: 2014-02-02 | Disposition: A | Discharge status: Home / Self Care | Payer: Medicare Other | Source: Ambulatory Visit | Attending: Geriatric Medicine | Admitting: Geriatric Medicine

## 2014-02-02 DIAGNOSIS — R0989 Other specified symptoms and signs involving the circulatory and respiratory systems: Secondary | ICD-10-CM | POA: Diagnosis not present

## 2014-02-02 NOTE — Progress Notes (Signed)
VASCULAR LAB PRELIMINARY  ARTERIAL  ABI completed:    RIGHT    LEFT    PRESSURE WAVEFORM  PRESSURE WAVEFORM  BRACHIAL 125 Triphasic BRACHIAL 123 Triphasic  DP 99 Triphasic DP 63 Triphasic  AT   AT    PT 108 Triphasic PT 129 Triphasic  PER   PER    GREAT TOE  NA GREAT TOE  NA    RIGHT LEFT  ABI 0.86 1.03    Study was technically difficult due to the patient's inability to cooperate and advanced age. The right ABI is suggestive of mild arterial insufficiency. The left ABI is within normal limits. The left dorsalis pedis artery pressure may be falsely diminished due to poor patient cooperation. Patient was unable to cooperate for lower extremity arterial duplex.

## 2014-05-27 DEATH — deceased

## 2015-10-17 IMAGING — CR DG CHEST 1V PORT
1 series · 1 of 1 positions shown · non-contrast
Comparison: 07/17/2008

CLINICAL DATA: Hypoxia.

EXAM:
PORTABLE CHEST - 1 VIEW

[AP]
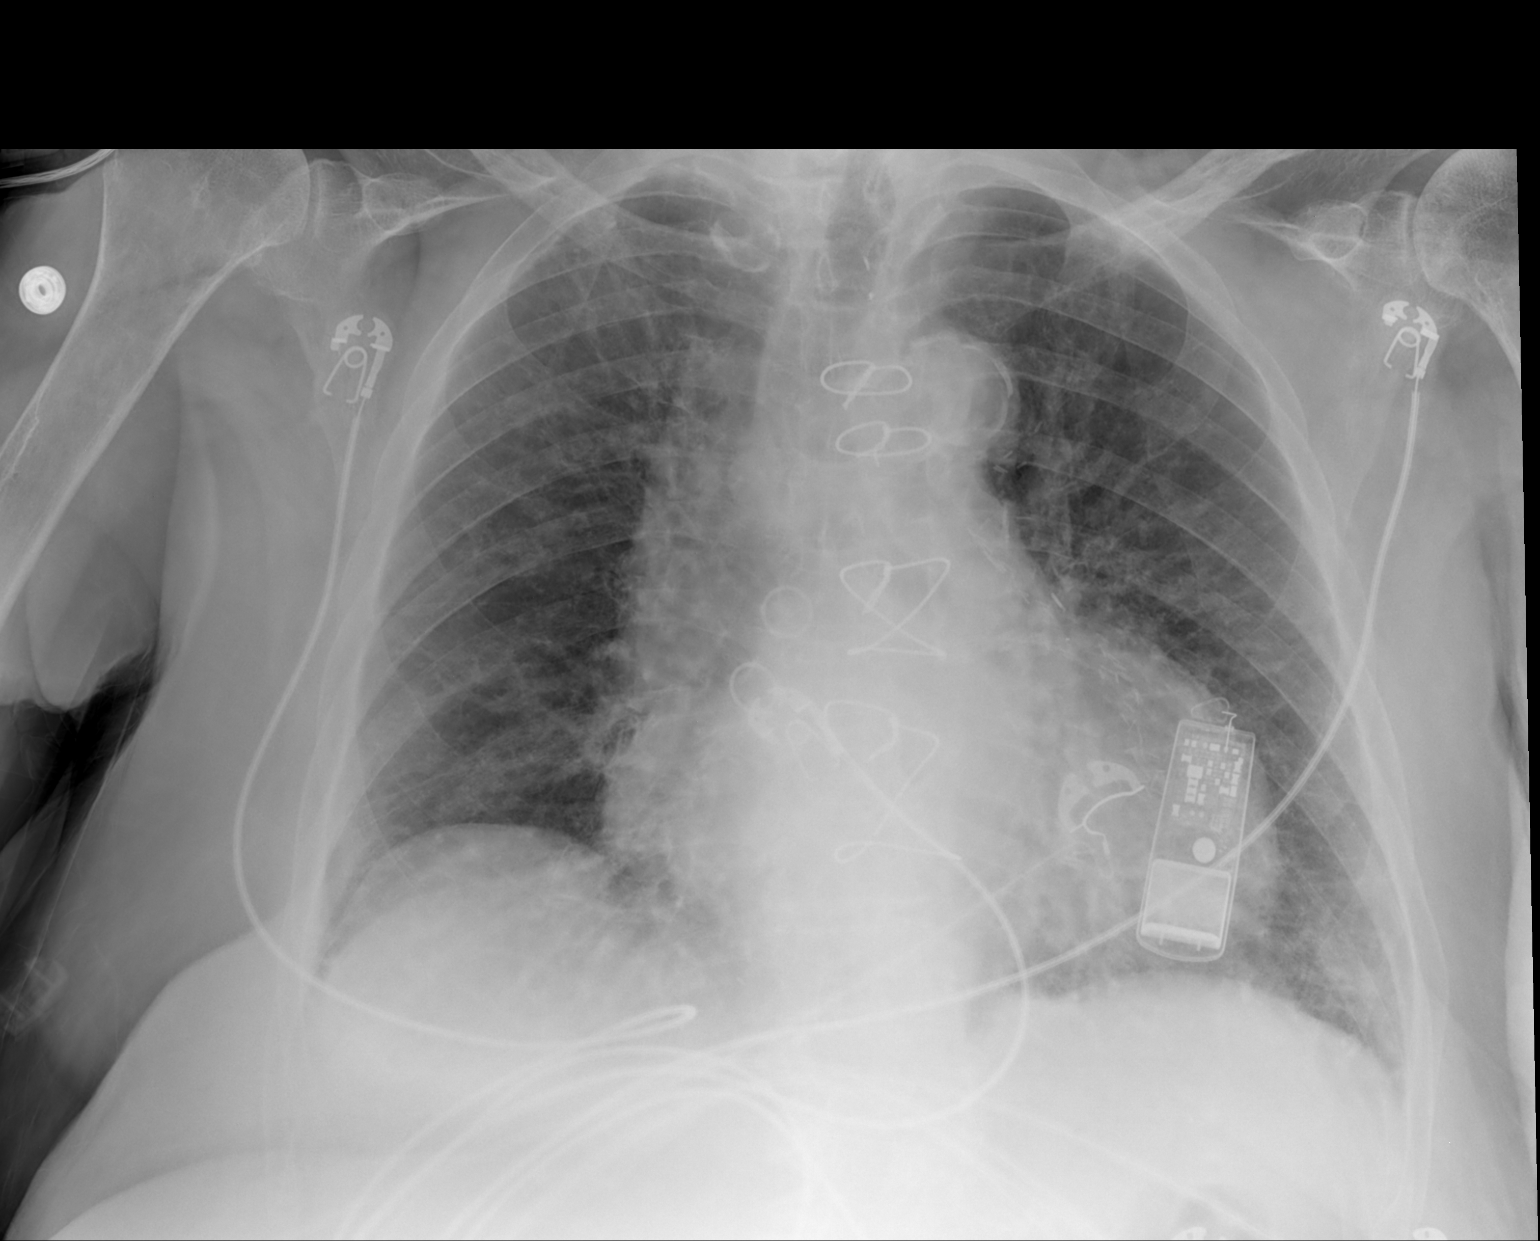

[1 of 1 positions shown; findings below may reference images not displayed]

FINDINGS: Stable changes from previous CABG surgery the cardiac silhouette is
mildly enlarged. No mediastinal or hilar masses.

There are prominent bronchovascular markings. Mild interstitial
thickening is noted in the peripheral lung bases. There is no
consolidation. There is no convincing edema. No pleural effusion or
pneumothorax is seen.

The bony thorax is diffusely demineralized.
IMPRESSION: No acute cardiopulmonary disease.
